# Patient Record
Sex: Male | Born: 1970
Health system: Southern US, Community
[De-identification: ages and names within clinical notes are randomized; demographics above are authoritative.]

## PROBLEM LIST (undated history)

## (undated) DIAGNOSIS — E785 Hyperlipidemia, unspecified: Secondary | ICD-10-CM

## (undated) DIAGNOSIS — E079 Disorder of thyroid, unspecified: Secondary | ICD-10-CM

## (undated) DIAGNOSIS — J309 Allergic rhinitis, unspecified: Secondary | ICD-10-CM

## (undated) DIAGNOSIS — T7840XA Allergy, unspecified, initial encounter: Secondary | ICD-10-CM

## (undated) DIAGNOSIS — I10 Essential (primary) hypertension: Secondary | ICD-10-CM

## (undated) DIAGNOSIS — Z8489 Family history of other specified conditions: Secondary | ICD-10-CM

## (undated) HISTORY — DX: Family history of other specified conditions: Z84.89

## (undated) HISTORY — DX: Disorder of thyroid, unspecified: E07.9

## (undated) HISTORY — DX: Essential (primary) hypertension: I10

## (undated) HISTORY — PX: OTHER SURGICAL HISTORY: SHX169

## (undated) HISTORY — PX: BACK SURGERY: SHX140

## (undated) HISTORY — DX: Hyperlipidemia, unspecified: E78.5

## (undated) HISTORY — DX: Allergy, unspecified, initial encounter: T78.40XA

## (undated) HISTORY — DX: Allergic rhinitis, unspecified: J30.9

## (undated) HISTORY — PX: SPINE SURGERY: SHX786

---

## 2002-10-18 ENCOUNTER — Encounter: Payer: Self-pay | Admitting: Family Medicine

## 2002-10-18 ENCOUNTER — Ambulatory Visit (HOSPITAL_COMMUNITY): Admission: RE | Admit: 2002-10-18 | Discharge: 2002-10-18 | Payer: Self-pay | Admitting: Family Medicine

## 2002-11-02 ENCOUNTER — Encounter (HOSPITAL_COMMUNITY): Admission: RE | Admit: 2002-11-02 | Discharge: 2002-12-02 | Payer: Self-pay | Admitting: Neurological Surgery

## 2002-12-12 ENCOUNTER — Encounter (HOSPITAL_COMMUNITY): Admission: RE | Admit: 2002-12-12 | Discharge: 2003-01-11 | Payer: Self-pay | Admitting: Neurological Surgery

## 2003-01-18 ENCOUNTER — Encounter: Admission: RE | Admit: 2003-01-18 | Discharge: 2003-03-02 | Payer: Self-pay | Admitting: Neurological Surgery

## 2003-08-29 ENCOUNTER — Inpatient Hospital Stay (HOSPITAL_COMMUNITY): Admission: RE | Admit: 2003-08-29 | Discharge: 2003-08-31 | Payer: Self-pay | Admitting: Neurological Surgery

## 2003-09-15 ENCOUNTER — Ambulatory Visit (HOSPITAL_COMMUNITY): Admission: RE | Admit: 2003-09-15 | Discharge: 2003-09-15 | Payer: Self-pay | Admitting: Family Medicine

## 2005-04-15 ENCOUNTER — Ambulatory Visit (HOSPITAL_COMMUNITY): Admission: RE | Admit: 2005-04-15 | Discharge: 2005-04-15 | Payer: Self-pay | Admitting: Family Medicine

## 2005-04-23 ENCOUNTER — Encounter (HOSPITAL_COMMUNITY): Admission: RE | Admit: 2005-04-23 | Discharge: 2005-05-23 | Payer: Self-pay | Admitting: Family Medicine

## 2005-04-30 ENCOUNTER — Ambulatory Visit: Payer: Self-pay | Admitting: Orthopedic Surgery

## 2005-05-02 ENCOUNTER — Ambulatory Visit (HOSPITAL_COMMUNITY): Admission: RE | Admit: 2005-05-02 | Discharge: 2005-05-02 | Payer: Self-pay | Admitting: Orthopedic Surgery

## 2005-05-14 ENCOUNTER — Ambulatory Visit: Payer: Self-pay | Admitting: Orthopedic Surgery

## 2005-06-26 ENCOUNTER — Ambulatory Visit: Payer: Self-pay | Admitting: Orthopedic Surgery

## 2006-10-06 ENCOUNTER — Ambulatory Visit (HOSPITAL_COMMUNITY): Admission: RE | Admit: 2006-10-06 | Discharge: 2006-10-06 | Payer: Self-pay | Admitting: Family Medicine

## 2010-12-12 ENCOUNTER — Other Ambulatory Visit: Payer: Self-pay | Admitting: Occupational Medicine

## 2010-12-12 ENCOUNTER — Ambulatory Visit
Admission: RE | Admit: 2010-12-12 | Discharge: 2010-12-12 | Disposition: A | Discharge status: Home / Self Care | Payer: PRIVATE HEALTH INSURANCE | Source: Ambulatory Visit | Attending: Occupational Medicine | Admitting: Occupational Medicine

## 2010-12-12 DIAGNOSIS — Z0289 Encounter for other administrative examinations: Secondary | ICD-10-CM

## 2012-11-21 DIAGNOSIS — J302 Other seasonal allergic rhinitis: Secondary | ICD-10-CM | POA: Insufficient documentation

## 2012-12-01 ENCOUNTER — Encounter: Payer: Self-pay | Admitting: *Deleted

## 2012-12-07 ENCOUNTER — Encounter: Payer: Self-pay | Admitting: Family Medicine

## 2012-12-07 ENCOUNTER — Ambulatory Visit (INDEPENDENT_AMBULATORY_CARE_PROVIDER_SITE_OTHER): Payer: 59 | Admitting: Family Medicine

## 2012-12-07 VITALS — BP 130/80 | HR 70 | Ht 68.0 in | Wt 196.0 lb

## 2012-12-07 DIAGNOSIS — R5383 Other fatigue: Secondary | ICD-10-CM

## 2012-12-07 DIAGNOSIS — Z79899 Other long term (current) drug therapy: Secondary | ICD-10-CM

## 2012-12-07 DIAGNOSIS — E785 Hyperlipidemia, unspecified: Secondary | ICD-10-CM

## 2012-12-07 DIAGNOSIS — R5381 Other malaise: Secondary | ICD-10-CM

## 2012-12-07 DIAGNOSIS — R748 Abnormal levels of other serum enzymes: Secondary | ICD-10-CM

## 2012-12-07 NOTE — Progress Notes (Signed)
  Subjective:    Patient ID: Brandon Williams, male    DOB: 1970-09-03, 42 y.o.   MRN: [id number]  HPI Exercising three to four days per week. Running. Tried to cut wieght, llost 20 lbs. Patient has history of elevated liver enzymes. He hopes they have come down since he lost weight. Also history of borderline TSH. None checked again recently. Known hyperlipidemia. Trying to watch his diet. Overall doing well. No acute concerns. Wonders if he is to colonoscopy. No family history of colon cancer. Patient advised not yet. Review of Systems  Constitutional: Negative for fever, activity change and appetite change.  HENT: Negative for congestion, rhinorrhea and neck pain.   Eyes: Negative for discharge.  Respiratory: Negative for cough and wheezing.   Cardiovascular: Negative for chest pain.  Gastrointestinal: Negative for vomiting, abdominal pain and blood in stool.  Genitourinary: Negative for frequency and difficulty urinating.  Skin: Negative for rash.  Allergic/Immunologic: Negative for environmental allergies and food allergies.  Neurological: Negative for weakness and headaches.  Psychiatric/Behavioral: Negative for agitation.       Objective:   Physical Exam  Constitutional: He appears well-developed and well-nourished.  HENT:  Head: Normocephalic and atraumatic.  Right Ear: External ear normal.  Left Ear: External ear normal.  Nose: Nose normal.  Mouth/Throat: Oropharynx is clear and moist.  Eyes: EOM are normal. Pupils are equal, round, and reactive to light.  Neck: Normal range of motion. Neck supple. No thyromegaly present.  Cardiovascular: Normal rate, regular rhythm and normal heart sounds.   No murmur heard. Pulmonary/Chest: Effort normal and breath sounds normal. No respiratory distress. He has no wheezes.  Abdominal: Soft. Bowel sounds are normal. He exhibits no distension and no mass. There is no tenderness.  Genitourinary: Penis normal.  Musculoskeletal: Normal  range of motion. He exhibits no edema.  Lymphadenopathy:    He has no cervical adenopathy.  Neurological: He is alert. He exhibits normal muscle tone.  Skin: Skin is warm and dry. No erythema.  Psychiatric: He has a normal mood and affect. His behavior is normal. Judgment normal.          Assessment & Plan:  Impression wellness exam. Plan diet exercise discussed in encourage. #2 hyperlipidemia status uncertain. #3 elevated liver enzyme history status uncertain. #4 borderline TSH status uncertain. Plan appropriate blood work. Diet exercise discussed.

## 2012-12-09 DIAGNOSIS — E785 Hyperlipidemia, unspecified: Secondary | ICD-10-CM | POA: Insufficient documentation

## 2012-12-09 DIAGNOSIS — R748 Abnormal levels of other serum enzymes: Secondary | ICD-10-CM | POA: Insufficient documentation

## 2012-12-10 LAB — HEPATIC FUNCTION PANEL
ALT: 32 IU/L (ref 0–44)
AST: 45 IU/L — ABNORMAL HIGH (ref 0–40)
Albumin: 4.4 g/dL (ref 3.5–5.5)
Alkaline Phosphatase: 61 IU/L (ref 39–117)
Bilirubin, Direct: 0.11 mg/dL (ref 0.00–0.40)
Total Bilirubin: 0.4 mg/dL (ref 0.0–1.2)
Total Protein: 6.5 g/dL (ref 6.0–8.5)

## 2012-12-10 LAB — BASIC METABOLIC PANEL
BUN/Creatinine Ratio: 13 (ref 9–20)
BUN: 14 mg/dL (ref 6–24)
CO2: 26 mmol/L (ref 19–28)
Calcium: 8.9 mg/dL (ref 8.7–10.2)
Chloride: 104 mmol/L (ref 97–108)
Creatinine, Ser: 1.07 mg/dL (ref 0.76–1.27)
GFR calc Af Amer: 99 mL/min/{1.73_m2} (ref >59)
GFR calc non Af Amer: 86 mL/min/{1.73_m2} (ref >59)
Glucose: 86 mg/dL (ref 65–99)
Potassium: 4.5 mmol/L (ref 3.5–5.2)
Sodium: 142 mmol/L (ref 134–144)

## 2012-12-10 LAB — TSH: TSH: 5.95 u[IU]/mL — ABNORMAL HIGH (ref 0.450–4.500)

## 2012-12-10 LAB — LIPID PANEL
Chol/HDL Ratio: 4.5 ratio units (ref 0.0–5.0)
Cholesterol, Total: 190 mg/dL (ref 100–199)
HDL: 42 mg/dL (ref >39)
LDL Calculated: 124 mg/dL — ABNORMAL HIGH (ref 0–99)
Triglycerides: 122 mg/dL (ref 0–149)
VLDL Cholesterol Cal: 24 mg/dL (ref 5–40)

## 2012-12-17 ENCOUNTER — Telehealth: Payer: Self-pay | Admitting: Family Medicine

## 2012-12-17 NOTE — Telephone Encounter (Signed)
Patient stated he can see his results in My chart and sees a lot of abnormal results and is concerned. Results are in your results basket.

## 2012-12-17 NOTE — Telephone Encounter (Signed)
Call pt. o v next wk to discuss results and approach to findings. Too complicated to discuss over the phone and will require some further testing that we will discuss and arrange then

## 2012-12-17 NOTE — Telephone Encounter (Signed)
Patient is calling to get his BW results.

## 2012-12-17 NOTE — Telephone Encounter (Signed)
Office visit scheduled to discuss labs results.

## 2012-12-22 ENCOUNTER — Encounter: Payer: Self-pay | Admitting: Family Medicine

## 2012-12-22 ENCOUNTER — Ambulatory Visit (INDEPENDENT_AMBULATORY_CARE_PROVIDER_SITE_OTHER): Payer: 59 | Admitting: Family Medicine

## 2012-12-22 VITALS — BP 110/78 | HR 70 | Wt 201.6 lb

## 2012-12-22 DIAGNOSIS — R748 Abnormal levels of other serum enzymes: Secondary | ICD-10-CM

## 2012-12-22 DIAGNOSIS — E785 Hyperlipidemia, unspecified: Secondary | ICD-10-CM

## 2012-12-22 NOTE — Progress Notes (Signed)
  Subjective:    Patient ID: Brandon Williams, male    DOB: 1971-01-29, 42 y.o.   MRN: [id number]  HPI  Results for orders placed in visit on 12/07/12  LIPID PANEL      Result Value Range   Cholesterol, Total 190  100 - 199 mg/dL   Triglycerides 956  0 - 149 mg/dL   HDL 42  >21 mg/dL   VLDL Cholesterol Cal 24  5 - 40 mg/dL   LDL Calculated 308 (*) 0 - 99 mg/dL   Chol/HDL Ratio 4.5  0.0 - 5.0 ratio units  HEPATIC FUNCTION PANEL      Result Value Range   Total Protein 6.5  6.0 - 8.5 g/dL   Albumin 4.4  3.5 - 5.5 g/dL   Total Bilirubin 0.4  0.0 - 1.2 mg/dL   Bilirubin, Direct 6.57  0.00 - 0.40 mg/dL   Alkaline Phosphatase 61  39 - 117 IU/L   AST 45 (*) 0 - 40 IU/L   ALT 32  0 - 44 IU/L  TSH      Result Value Range   TSH 5.950 (*) 0.450 - 4.500 uIU/mL   COMMENT Comment    BASIC METABOLIC PANEL      Result Value Range   Glucose 86  65 - 99 mg/dL   BUN 14  6 - 24 mg/dL   Creatinine, Ser 8.46  0.76 - 1.27 mg/dL   GFR calc non Af Amer 86  >59 mL/min/1.73   GFR calc Af Amer 99  >59 mL/min/1.73   BUN/Creatinine Ratio 13  9 - 20   Sodium 142  134 - 144 mmol/L   Potassium 4.5  3.5 - 5.2 mmol/L   Chloride 104  97 - 108 mmol/L   CO2 26  19 - 28 mmol/L   Calcium 8.9  8.7 - 10.2 mg/dL   Patient arrives for a substantial discussion regarding his blood work results  Review of Systems ROS otherwise negative    Objective:   Physical Exam   Alert no acute distress HEENT normal. Lungs clear. Heart regular rate and rhythm. Vital stable.     Assessment & Plan:  Impression #1 mild elevation of liver enzymes discussed at great length. He has had a prior workup in the past. This revealed fatty liver. In addition he had negative studies for hepatitis and negative studies for hemachromatosis. #2 hyperlipidemia and discuss. #3 TSH slightly elevated with no symptoms of hypothyroidism. Plan 15 minutes spent most in discussion. Patient to work hard on his diet and repeat the studies next year.  WSL

## 2013-04-11 ENCOUNTER — Encounter: Payer: Self-pay | Admitting: Family Medicine

## 2013-04-11 NOTE — Telephone Encounter (Signed)
Thank you for your request for refills. For standard refills we will request that you, the patient ,contact your pharmacy to send [place] an electronic request for refills or a fax requesting the refills. Almost all the pharmacies that our patients go to routinely do this for refills. This way we can accurately and quickly refill your prescription. Please contact your pharmacy right away for this request. Controlled medications such as nerve pills or pain pills may be requested through My Chart. Thank you for your cooperation, [facility]

## 2013-05-12 ENCOUNTER — Other Ambulatory Visit: Payer: Self-pay

## 2014-12-11 ENCOUNTER — Encounter: Payer: Self-pay | Admitting: Family Medicine

## 2014-12-11 ENCOUNTER — Ambulatory Visit (INDEPENDENT_AMBULATORY_CARE_PROVIDER_SITE_OTHER): Payer: 59 | Admitting: Family Medicine

## 2014-12-11 VITALS — BP 132/84 | Ht 68.0 in | Wt 211.3 lb

## 2014-12-11 DIAGNOSIS — R5383 Other fatigue: Secondary | ICD-10-CM

## 2014-12-11 DIAGNOSIS — E785 Hyperlipidemia, unspecified: Secondary | ICD-10-CM

## 2014-12-11 DIAGNOSIS — Z0000 Encounter for general adult medical examination without abnormal findings: Secondary | ICD-10-CM | POA: Diagnosis not present

## 2014-12-11 DIAGNOSIS — Z79899 Other long term (current) drug therapy: Secondary | ICD-10-CM | POA: Diagnosis not present

## 2014-12-11 NOTE — Progress Notes (Signed)
   Subjective:    Patient ID: Brandon Williams, male    DOB: [birth year], 44 y.o.   MRN: [id number]  HPI The patient comes in today for a wellness visit.    A review of their health history was completed.  A review of medications was also completed.  Any needed refills; no  Eating habits: could be better  Falls/  MVA accidents in past few months: none  Regular exercise: 3-4 days a week  Specialist pt sees on regular basis: no  Preventative health issues were discussed.   Additional concerns: none  Still exercising regulaly running and going to the gym three or four days per wk  Not watching diet as closely  Some tendency on fa's side to gain weight.    Review of Systems  Constitutional: Negative for fever, activity change and appetite change.  HENT: Negative for congestion and rhinorrhea.   Eyes: Negative for discharge.  Respiratory: Negative for cough and wheezing.   Cardiovascular: Negative for chest pain.  Gastrointestinal: Negative for vomiting, abdominal pain and blood in stool.  Genitourinary: Negative for frequency and difficulty urinating.  Musculoskeletal: Negative for neck pain.  Skin: Negative for rash.  Allergic/Immunologic: Negative for environmental allergies and food allergies.  Neurological: Negative for weakness and headaches.  Psychiatric/Behavioral: Negative for agitation.  All other systems reviewed and are negative.      Objective:   Physical Exam  Constitutional: He appears well-developed and well-nourished.  HENT:  Head: Normocephalic and atraumatic.  Right Ear: External ear normal.  Left Ear: External ear normal.  Nose: Nose normal.  Mouth/Throat: Oropharynx is clear and moist.  Eyes: EOM are normal. Pupils are equal, round, and reactive to light.  Neck: Normal range of motion. Neck supple. No thyromegaly present.  Cardiovascular: Normal rate, regular rhythm and normal heart sounds.   No murmur heard. Pulmonary/Chest: Effort  normal and breath sounds normal. No respiratory distress. He has no wheezes.  Abdominal: Soft. Bowel sounds are normal. He exhibits no distension and no mass. There is no tenderness.  Genitourinary: Prostate normal and penis normal.  Musculoskeletal: Normal range of motion. He exhibits no edema.  Lymphadenopathy:    He has no cervical adenopathy.  Neurological: He is alert. He exhibits normal muscle tone.  Skin: Skin is warm and dry. No erythema.  Psychiatric: He has a normal mood and affect. His behavior is normal. Judgment normal.          Assessment & Plan:  Impression #1 wellness exam #2 history of borderline TSH. #3 elevated liver enzymes with fatty liver plan diet exercise discussed. Appropriate blood work. Form filled out. WSL

## 2014-12-12 LAB — HEPATIC FUNCTION PANEL
ALT: 35 IU/L (ref 0–44)
AST: 38 IU/L (ref 0–40)
Albumin: 4.7 g/dL (ref 3.5–5.5)
Alkaline Phosphatase: 66 IU/L (ref 39–117)
Bilirubin Total: 0.7 mg/dL (ref 0.0–1.2)
Bilirubin, Direct: 0.17 mg/dL (ref 0.00–0.40)
Total Protein: 6.9 g/dL (ref 6.0–8.5)

## 2014-12-12 LAB — TSH: TSH: 6.26 u[IU]/mL — ABNORMAL HIGH (ref 0.450–4.500)

## 2014-12-12 LAB — BASIC METABOLIC PANEL
BUN/Creatinine Ratio: 12 (ref 9–20)
BUN: 14 mg/dL (ref 6–24)
CO2: 24 mmol/L (ref 18–29)
Calcium: 9.1 mg/dL (ref 8.7–10.2)
Chloride: 100 mmol/L (ref 97–108)
Creatinine, Ser: 1.18 mg/dL (ref 0.76–1.27)
GFR calc Af Amer: 87 mL/min/{1.73_m2} (ref >59)
GFR calc non Af Amer: 75 mL/min/{1.73_m2} (ref >59)
Glucose: 94 mg/dL (ref 65–99)
Potassium: 4.5 mmol/L (ref 3.5–5.2)
Sodium: 140 mmol/L (ref 134–144)

## 2014-12-12 LAB — LIPID PANEL
Chol/HDL Ratio: 6.2 ratio units — ABNORMAL HIGH (ref 0.0–5.0)
Cholesterol, Total: 193 mg/dL (ref 100–199)
HDL: 31 mg/dL — ABNORMAL LOW (ref >39)
LDL Calculated: 107 mg/dL — ABNORMAL HIGH (ref 0–99)
Triglycerides: 275 mg/dL — ABNORMAL HIGH (ref 0–149)
VLDL Cholesterol Cal: 55 mg/dL — ABNORMAL HIGH (ref 5–40)

## 2014-12-19 ENCOUNTER — Other Ambulatory Visit: Payer: Self-pay

## 2014-12-19 MED ORDER — LEVOTHYROXINE SODIUM 50 MCG PO TABS: 50.0000 ug | ORAL_TABLET | Freq: Every day | ORAL | Status: DC

## 2015-03-12 ENCOUNTER — Encounter: Payer: Self-pay | Admitting: Family Medicine

## 2015-03-12 DIAGNOSIS — E039 Hypothyroidism, unspecified: Secondary | ICD-10-CM

## 2015-04-06 ENCOUNTER — Telehealth: Payer: Self-pay | Admitting: Family Medicine

## 2015-04-06 NOTE — Telephone Encounter (Signed)
[facility] to fax results

## 2015-04-06 NOTE — Telephone Encounter (Signed)
Pt is looking for his results for his TSH form 9/6  He states he did not get an email  Please advise

## 2015-04-06 NOTE — Telephone Encounter (Signed)
Results on desk- patient wants results today

## 2015-04-06 NOTE — Telephone Encounter (Signed)
Results discussed with patient. Patient verbalized understanding.

## 2015-04-06 NOTE — Telephone Encounter (Signed)
tsh excellent , 2.11 no need for further tests, send copy to pt

## 2015-06-06 ENCOUNTER — Other Ambulatory Visit: Payer: Self-pay | Admitting: Family Medicine

## 2015-06-18 ENCOUNTER — Telehealth: Payer: Self-pay | Admitting: Family Medicine

## 2015-06-18 NOTE — Telephone Encounter (Signed)
Pt is needing a refill on his levothyroxine. Pt ran out yesterday.   [facility] [place]

## 2015-06-18 NOTE — Telephone Encounter (Signed)
Spoke with patient and informed him that we sent in 5 refills on 06/06/15. Patient verbalized understanding.

## 2015-07-26 ENCOUNTER — Other Ambulatory Visit: Payer: Self-pay | Admitting: Nurse Practitioner

## 2015-07-26 ENCOUNTER — Ambulatory Visit
Admission: RE | Admit: 2015-07-26 | Discharge: 2015-07-26 | Disposition: A | Discharge status: Home / Self Care | Payer: No Typology Code available for payment source | Source: Ambulatory Visit | Attending: Nurse Practitioner | Admitting: Nurse Practitioner

## 2015-07-26 DIAGNOSIS — Z0000 Encounter for general adult medical examination without abnormal findings: Secondary | ICD-10-CM

## 2015-09-07 ENCOUNTER — Encounter: Payer: Self-pay | Admitting: Family Medicine

## 2015-09-07 MED ORDER — LEVOTHYROXINE SODIUM 50 MCG PO TABS: ORAL_TABLET | ORAL | Status: DC

## 2015-11-26 ENCOUNTER — Other Ambulatory Visit: Payer: Self-pay | Admitting: *Deleted

## 2015-11-26 MED ORDER — LEVOTHYROXINE SODIUM 50 MCG PO TABS: ORAL_TABLET | ORAL | Status: DC

## 2016-02-17 ENCOUNTER — Other Ambulatory Visit: Payer: Self-pay | Admitting: Family Medicine

## 2016-02-18 NOTE — Telephone Encounter (Signed)
Pt not seen 14 months, thierty days worth needs o v

## 2016-02-29 ENCOUNTER — Encounter: Payer: Self-pay | Admitting: Family Medicine

## 2016-02-29 DIAGNOSIS — Z139 Encounter for screening, unspecified: Secondary | ICD-10-CM

## 2016-03-22 LAB — BASIC METABOLIC PANEL
BUN/Creatinine Ratio: 10 (ref 9–20)
BUN: 12 mg/dL (ref 6–24)
CALCIUM: 9.2 mg/dL (ref 8.7–10.2)
CHLORIDE: 99 mmol/L (ref 96–106)
CO2: 24 mmol/L (ref 18–29)
Creatinine, Ser: 1.15 mg/dL (ref 0.76–1.27)
GFR calc non Af Amer: 76 mL/min/{1.73_m2} (ref >59)
GFR, EST AFRICAN AMERICAN: 88 mL/min/{1.73_m2} (ref >59)
GLUCOSE: 97 mg/dL (ref 65–99)
Potassium: 4.7 mmol/L (ref 3.5–5.2)
Sodium: 138 mmol/L (ref 134–144)

## 2016-03-22 LAB — TSH: TSH: 4.05 u[IU]/mL (ref 0.450–4.500)

## 2016-03-22 LAB — LIPID PANEL
Chol/HDL Ratio: 7.4 ratio units — ABNORMAL HIGH (ref 0.0–5.0)
Cholesterol, Total: 236 mg/dL — ABNORMAL HIGH (ref 100–199)
HDL: 32 mg/dL — ABNORMAL LOW (ref >39)
LDL Calculated: 133 mg/dL — ABNORMAL HIGH (ref 0–99)
Triglycerides: 356 mg/dL — ABNORMAL HIGH (ref 0–149)
VLDL Cholesterol Cal: 71 mg/dL — ABNORMAL HIGH (ref 5–40)

## 2016-03-22 LAB — HEPATIC FUNCTION PANEL
ALT: 53 IU/L — AB (ref 0–44)
AST: 50 IU/L — ABNORMAL HIGH (ref 0–40)
Albumin: 4.8 g/dL (ref 3.5–5.5)
Alkaline Phosphatase: 60 IU/L (ref 39–117)
BILIRUBIN TOTAL: 0.5 mg/dL (ref 0.0–1.2)
Bilirubin, Direct: 0.15 mg/dL (ref 0.00–0.40)
Total Protein: 7.3 g/dL (ref 6.0–8.5)

## 2016-04-10 ENCOUNTER — Encounter: Payer: Self-pay | Admitting: Family Medicine

## 2016-04-10 ENCOUNTER — Ambulatory Visit (INDEPENDENT_AMBULATORY_CARE_PROVIDER_SITE_OTHER): Payer: 59 | Admitting: Family Medicine

## 2016-04-10 VITALS — BP 112/80 | Ht 69.5 in | Wt 224.4 lb

## 2016-04-10 DIAGNOSIS — Z13220 Encounter for screening for lipoid disorders: Secondary | ICD-10-CM | POA: Diagnosis not present

## 2016-04-10 DIAGNOSIS — Z79899 Other long term (current) drug therapy: Secondary | ICD-10-CM

## 2016-04-10 DIAGNOSIS — R748 Abnormal levels of other serum enzymes: Secondary | ICD-10-CM

## 2016-04-10 DIAGNOSIS — E039 Hypothyroidism, unspecified: Secondary | ICD-10-CM

## 2016-04-10 DIAGNOSIS — Z0000 Encounter for general adult medical examination without abnormal findings: Secondary | ICD-10-CM

## 2016-04-10 MED ORDER — LEVOTHYROXINE SODIUM 75 MCG PO TABS: 75.0000 ug | ORAL_TABLET | Freq: Every day | ORAL | 5 refills | Status: DC

## 2016-04-10 NOTE — Progress Notes (Signed)
Subjective:    Patient ID: Brandon Williams, male    DOB: Nov 23, 1970, 45 y.o.   MRN: [id number]  HPI The patient comes in today for a wellness visit.   Results for orders placed or performed in visit on 02/29/16  Lipid panel  Result Value Ref Range   Cholesterol, Total 236 (H) 100 - 199 mg/dL   Triglycerides 356 (H) 0 - 149 mg/dL   HDL 32 (L) >39 mg/dL   VLDL Cholesterol Cal 71 (H) 5 - 40 mg/dL   LDL Calculated 133 (H) 0 - 99 mg/dL   Chol/HDL Ratio 7.4 (H) 0.0 - 5.0 ratio units  Hepatic function panel  Result Value Ref Range   Total Protein 7.3 6.0 - 8.5 g/dL   Albumin 4.8 3.5 - 5.5 g/dL   Bilirubin Total 0.5 0.0 - 1.2 mg/dL   Bilirubin, Direct 0.15 0.00 - 0.40 mg/dL   Alkaline Phosphatase 60 39 - 117 IU/L   AST 50 (H) 0 - 40 IU/L   ALT 53 (H) 0 - 44 IU/L  Basic metabolic panel  Result Value Ref Range   Glucose 97 65 - 99 mg/dL   BUN 12 6 - 24 mg/dL   Creatinine, Ser 1.15 0.76 - 1.27 mg/dL   GFR calc non Af Amer 76 >59 mL/min/1.73   GFR calc Af Amer 88 >59 mL/min/1.73   BUN/Creatinine Ratio 10 9 - 20   Sodium 138 134 - 144 mmol/L   Potassium 4.7 3.5 - 5.2 mmol/L   Chloride 99 96 - 106 mmol/L   CO2 24 18 - 29 mmol/L   Calcium 9.2 8.7 - 10.2 mg/dL  TSH  Result Value Ref Range   TSH 4.050 0.450 - 4.500 uIU/mL    A review of their health history was completed.  A review of medications was also completed.  Any needed refills: 90 day supply on levothyroxine Pos fam hx of thyroid dys function in fa and sister Slight fatigue at times but not particularly. Does not miss a dose on the thyroid   Eating habits: good   Falls/  MVA accidents in past few months: none  Regular exercise: not recently ,not currently   Specialist pt sees on regular basis: none  Preventative health issues were discussed.   Additional concerns: discuss bloodwork results  Patient gets flu shot through employer.   Review of Systems  Constitutional: Negative for activity change, appetite  change and fever.  HENT: Negative for congestion and rhinorrhea.   Eyes: Negative for discharge.  Respiratory: Negative for cough and wheezing.   Cardiovascular: Negative for chest pain.  Gastrointestinal: Negative for abdominal pain, blood in stool and vomiting.  Genitourinary: Negative for difficulty urinating and frequency.  Musculoskeletal: Negative for neck pain.  Skin: Negative for rash.  Allergic/Immunologic: Negative for environmental allergies and food allergies.  Neurological: Negative for weakness and headaches.  Psychiatric/Behavioral: Negative for agitation.  All other systems reviewed and are negative.      Objective:   Physical Exam  Constitutional: He appears well-developed and well-nourished.  HENT:  Head: Normocephalic and atraumatic.  Right Ear: External ear normal.  Left Ear: External ear normal.  Nose: Nose normal.  Mouth/Throat: Oropharynx is clear and moist.  Eyes: EOM are normal. Pupils are equal, round, and reactive to light.  Neck: Normal range of motion. Neck supple. No thyromegaly present.  Cardiovascular: Normal rate, regular rhythm and normal heart sounds.   No murmur heard. Pulmonary/Chest: Effort normal and breath sounds normal. No  respiratory distress. He has no wheezes.  Abdominal: Soft. Bowel sounds are normal. He exhibits no distension and no mass. There is no tenderness.  Genitourinary: Penis normal.  Genitourinary Comments: Prostate within normal limits  Musculoskeletal: Normal range of motion. He exhibits no edema.  Lymphadenopathy:    He has no cervical adenopathy.  Neurological: He is alert. He exhibits normal muscle tone.  Skin: Skin is warm and dry. No erythema.  Psychiatric: He has a normal mood and affect. His behavior is normal. Judgment normal.  Vitals reviewed.         Assessment & Plan:  Impression 1 well adult exam #2 hyperlipidemia discussed at length. #3 elevated liver enzymes. Has gained 10 pounds likely source  discussed needs to come down at next check discussed #3 hypothyroidism TSH now highish will increased dose and recheck in 4 months plan appropriate blood work in 4 months diet exercise discussed anticipatory guidance given WSL

## 2016-10-09 ENCOUNTER — Other Ambulatory Visit: Payer: Self-pay | Admitting: Family Medicine

## 2017-01-27 ENCOUNTER — Encounter: Payer: Self-pay | Admitting: Family Medicine

## 2017-01-27 ENCOUNTER — Ambulatory Visit (INDEPENDENT_AMBULATORY_CARE_PROVIDER_SITE_OTHER): Payer: 59 | Admitting: Family Medicine

## 2017-01-27 VITALS — BP 122/78 | Ht 69.5 in | Wt 217.6 lb

## 2017-01-27 DIAGNOSIS — L03113 Cellulitis of right upper limb: Secondary | ICD-10-CM | POA: Diagnosis not present

## 2017-01-27 NOTE — Progress Notes (Signed)
   Subjective:    Patient ID: Brandon Williams, male    DOB: 12-21-1970, 46 y.o.   MRN: [id number]  HPI Patient arrives for removal of stitches in right hand-injury due to dog bite.  Patient brings pictures of the wounds prior to repair. Of note patient's hands were actually bitten twice over the course of 2 days.  He is approximate 5 days into a 10 day course of Augmentin. Next  Report substantially less swelling of the right hand at this time. Less tenderness.  Needs sutures removed.  ER records reviewed Review of Systems No headache, no major weight loss or weight gain, no chest pain no back pain abdominal pain no change in bowel habits complete ROS otherwise negative     Objective:   Physical Exam  Alert vitals stable, NAD. Blood pressure good on repeat. HEENT normal. Lungs clear. Heart regular rate and rhythm. Right hand still substantial swelling evident some tenderness to deep palpation able to home without significant pain  Wounds healing slowly sutures removed      Assessment & Plan:  Impression dog bite with both puncture wounds and lacerations. Potential element of cellulitis clinically improved. Highly doubt webspace infection. Continue antibiotics 1 signs discussed

## 2017-04-09 ENCOUNTER — Other Ambulatory Visit: Payer: Self-pay | Admitting: Family Medicine

## 2017-05-10 ENCOUNTER — Other Ambulatory Visit: Payer: Self-pay | Admitting: Family Medicine

## 2017-05-14 ENCOUNTER — Other Ambulatory Visit: Payer: Self-pay | Admitting: Family Medicine

## 2017-05-15 LAB — HEPATIC FUNCTION PANEL
ALT: 48 IU/L — ABNORMAL HIGH (ref 0–44)
AST: 48 IU/L — ABNORMAL HIGH (ref 0–40)
Albumin: 4.7 g/dL (ref 3.5–5.5)
Alkaline Phosphatase: 56 IU/L (ref 39–117)
Bilirubin Total: 0.4 mg/dL (ref 0.0–1.2)
Bilirubin, Direct: 0.12 mg/dL (ref 0.00–0.40)
TOTAL PROTEIN: 7 g/dL (ref 6.0–8.5)

## 2017-05-15 LAB — LIPID PANEL
Chol/HDL Ratio: 6.4 ratio — ABNORMAL HIGH (ref 0.0–5.0)
Cholesterol, Total: 211 mg/dL — ABNORMAL HIGH (ref 100–199)
HDL: 33 mg/dL — ABNORMAL LOW (ref >39)
LDL Calculated: 125 mg/dL — ABNORMAL HIGH (ref 0–99)
Triglycerides: 267 mg/dL — ABNORMAL HIGH (ref 0–149)
VLDL Cholesterol Cal: 53 mg/dL — ABNORMAL HIGH (ref 5–40)

## 2017-05-15 LAB — TSH: TSH: 2.83 u[IU]/mL (ref 0.450–4.500)

## 2017-05-21 MED ORDER — LEVOTHYROXINE SODIUM 75 MCG PO TABS: ORAL_TABLET | ORAL | 0 refills | Status: DC

## 2017-05-21 NOTE — Addendum Note (Signed)
Addended by: Ofilia Neas R on: 05/21/2017 02:41 PM   Modules accepted: Orders

## 2017-05-21 NOTE — Addendum Note (Signed)
Addended by: Ofilia Neas R on: 05/21/2017 01:34 PM   Modules accepted: Orders

## 2017-06-05 ENCOUNTER — Encounter: Payer: 59 | Admitting: Family Medicine

## 2017-06-26 ENCOUNTER — Ambulatory Visit (INDEPENDENT_AMBULATORY_CARE_PROVIDER_SITE_OTHER): Payer: 59 | Admitting: Family Medicine

## 2017-06-26 ENCOUNTER — Encounter: Payer: Self-pay | Admitting: Family Medicine

## 2017-06-26 VITALS — BP 122/88 | Ht 69.25 in | Wt 222.0 lb

## 2017-06-26 DIAGNOSIS — R748 Abnormal levels of other serum enzymes: Secondary | ICD-10-CM | POA: Diagnosis not present

## 2017-06-26 DIAGNOSIS — Z0001 Encounter for general adult medical examination with abnormal findings: Secondary | ICD-10-CM | POA: Diagnosis not present

## 2017-06-26 DIAGNOSIS — E039 Hypothyroidism, unspecified: Secondary | ICD-10-CM | POA: Diagnosis not present

## 2017-06-26 DIAGNOSIS — Z0000 Encounter for general adult medical examination without abnormal findings: Secondary | ICD-10-CM

## 2017-06-26 MED ORDER — LEVOTHYROXINE SODIUM 75 MCG PO TABS: ORAL_TABLET | ORAL | 0 refills | Status: DC

## 2017-06-26 NOTE — Progress Notes (Signed)
Subjective:    Patient ID: Brandon Williams, male    DOB: 16-Nov-1970, 46 y.o.   MRN: [id number]  HPI The patient comes in today for a wellness visit.  Results for orders placed or performed in visit on 05/14/17  Lipid panel  Result Value Ref Range   Cholesterol, Total 211 (H) 100 - 199 mg/dL   Triglycerides 267 (H) 0 - 149 mg/dL   HDL 33 (L) >39 mg/dL   VLDL Cholesterol Cal 53 (H) 5 - 40 mg/dL   LDL Calculated 125 (H) 0 - 99 mg/dL   Chol/HDL Ratio 6.4 (H) 0.0 - 5.0 ratio  Hepatic function panel  Result Value Ref Range   Total Protein 7.0 6.0 - 8.5 g/dL   Albumin 4.7 3.5 - 5.5 g/dL   Bilirubin Total 0.4 0.0 - 1.2 mg/dL   Bilirubin, Direct 0.12 0.00 - 0.40 mg/dL   Alkaline Phosphatase 56 39 - 117 IU/L   AST 48 (H) 0 - 40 IU/L   ALT 48 (H) 0 - 44 IU/L  TSH  Result Value Ref Range   TSH 2.830 0.450 - 4.500 uIU/mL     A review of their health history was completed.  A review of medications was also completed.  Any needed refills; yes on levothyroxine. Wants 90 day supply  Eating habits: health conscious  Falls/  MVA accidents in past few months: none  Regular exercise: none  Specialist pt sees on regular basis:  Preventative health issues were discussed.   Additional concerns: none    Review of Systems  Constitutional: Negative for activity change, appetite change and fever.  HENT: Negative for congestion and rhinorrhea.   Eyes: Negative for discharge.  Respiratory: Negative for cough and wheezing.   Cardiovascular: Negative for chest pain.  Gastrointestinal: Negative for abdominal pain, blood in stool and vomiting.  Genitourinary: Negative for difficulty urinating and frequency.  Musculoskeletal: Negative for neck pain.  Skin: Negative for rash.  Allergic/Immunologic: Negative for environmental allergies and food allergies.  Neurological: Negative for weakness and headaches.  Psychiatric/Behavioral: Negative for agitation.  All other systems reviewed and  are negative.      Objective:   Physical Exam  Constitutional: He appears well-developed and well-nourished.  HENT:  Head: Normocephalic and atraumatic.  Right Ear: External ear normal.  Left Ear: External ear normal.  Nose: Nose normal.  Mouth/Throat: Oropharynx is clear and moist.  Eyes: EOM are normal. Pupils are equal, round, and reactive to light.  Neck: Normal range of motion. Neck supple. No thyromegaly present.  Cardiovascular: Normal rate, regular rhythm and normal heart sounds.  No murmur heard. Pulmonary/Chest: Effort normal and breath sounds normal. No respiratory distress. He has no wheezes.  Abdominal: Soft. Bowel sounds are normal. He exhibits no distension and no mass. There is no tenderness.  Genitourinary: Penis normal.  Musculoskeletal: Normal range of motion. He exhibits no edema.  Lymphadenopathy:    He has no cervical adenopathy.  Neurological: He is alert. He exhibits normal muscle tone.  Skin: Skin is warm and dry. No erythema.  Psychiatric: He has a normal mood and affect. His behavior is normal. Judgment normal.  Vitals reviewed.         Assessment & Plan:  p impression wellness exam.  Diet discussed.  Exercise discussed.  Weight loss discussed.  General concerns discussed.  Anticipatory guidance given.  2.  Hypothyroidism.  TSH good control.  Discussed to maintain same dose of thyroid medicine 1 years worth written  3.  Elevated liver enzymes.  Discussed time to further assess.  Will order a last raphe of liver along with appropriate blood work.  Further recommendations based on results

## 2017-06-27 ENCOUNTER — Other Ambulatory Visit: Payer: Self-pay | Admitting: Family Medicine

## 2017-06-27 LAB — HEPATITIS C ANTIBODY: Hep C Virus Ab: <0.1 s/co ratio (ref 0.0–0.9)

## 2017-06-27 LAB — IRON: IRON: 46 ug/dL (ref 38–169)

## 2017-06-27 LAB — HEPATITIS B SURFACE ANTIGEN: Hepatitis B Surface Ag: NEGATIVE

## 2017-06-27 LAB — FERRITIN: FERRITIN: 306 ng/mL (ref 30–400)

## 2017-07-01 ENCOUNTER — Ambulatory Visit (HOSPITAL_COMMUNITY)
Admission: RE | Admit: 2017-07-01 | Discharge: 2017-07-01 | Disposition: A | Discharge status: Home / Self Care | Payer: 59 | Source: Ambulatory Visit | Attending: Family Medicine | Admitting: Family Medicine

## 2017-07-01 DIAGNOSIS — R748 Abnormal levels of other serum enzymes: Secondary | ICD-10-CM | POA: Diagnosis present

## 2017-07-01 DIAGNOSIS — K760 Fatty (change of) liver, not elsewhere classified: Secondary | ICD-10-CM | POA: Diagnosis not present

## 2017-07-23 ENCOUNTER — Encounter: Payer: Self-pay | Admitting: Family Medicine

## 2017-07-24 MED ORDER — LEVOTHYROXINE SODIUM 75 MCG PO TABS: ORAL_TABLET | ORAL | 1 refills | Status: DC

## 2017-10-23 ENCOUNTER — Emergency Department
Admission: EM | Admit: 2017-10-23 | Discharge: 2017-10-23 | Disposition: A | Discharge status: Home / Self Care | Payer: 59 | Source: Home / Self Care | Attending: Family Medicine | Admitting: Family Medicine

## 2017-10-23 ENCOUNTER — Other Ambulatory Visit: Payer: Self-pay

## 2017-10-23 ENCOUNTER — Encounter: Payer: Self-pay | Admitting: Emergency Medicine

## 2017-10-23 DIAGNOSIS — H1013 Acute atopic conjunctivitis, bilateral: Secondary | ICD-10-CM

## 2017-10-23 MED ORDER — OLOPATADINE HCL 0.2 % OP SOLN: OPHTHALMIC | 0 refills | Status: DC

## 2017-10-23 NOTE — Discharge Instructions (Addendum)
Continue daily antihistamine.

## 2017-10-23 NOTE — ED Triage Notes (Signed)
Patient has seasonal allergies and began daily antihistamine 09/04/17; despite OTCs he has been unusually congested and eyes are very irritated.

## 2017-10-23 NOTE — ED Provider Notes (Signed)
[facility]    CSN: [id number] Arrival date & time: 10/23/17  1420     History   Chief Complaint Chief Complaint  Patient presents with  . Nasal Congestion  . Eye Problem    HPI Brandon Williams is a 47 y.o. male.   Patient has seasonal allergic rhinitis and complains of two week history of increased "watering" eyes without significant redness or pain.  No foreign body sensation.  His eye symptoms have not improved with loratadine.  The history is provided by the patient.  Eye Problem  Location:  Both eyes Quality:  Tearing Severity:  Mild Onset quality:  Gradual Duration:  2 weeks Timing:  Intermittent Progression:  Worsening Chronicity:  Recurrent Context: not burn, not chemical exposure, not contact lens problem, not direct trauma, not foreign body, not using machinery, not scratch, not smoke exposure and not UV exposure   Relieved by:  Nothing Worsened by:  Nothing Ineffective treatments: antihistamines. Associated symptoms: itching and tearing   Associated symptoms: no blurred vision, no crusting, no decreased vision, no discharge, no double vision, no facial rash, no headaches, no inflammation, no photophobia, no redness, no scotomas and no swelling   Risk factors: no recent URI     Past Medical History:  Diagnosis Date  . Allergic rhinitis   . Hyperlipidemia     Patient Active Problem List   Diagnosis Date Noted  . Other and unspecified hyperlipidemia 12/09/2012  . Elevated liver enzymes 12/09/2012    Past Surgical History:  Procedure Laterality Date  . BACK SURGERY    . thumb surgery         Home Medications    Prior to Admission medications   Medication Sig Start Date End Date Taking? Authorizing Provider  levothyroxine (SYNTHROID, LEVOTHROID) 75 MCG tablet TAKE 1 TABLET BY MOUTH BEFORE BREAKFAST 07/24/17   Mikey Kirschner, MD  Olopatadine HCl 0.2 % SOLN Place one drop in each eye once daily for allergic symptoms 10/23/17    Kandra Nicolas, MD    Family History History reviewed. No pertinent family history.  Social History Social History   Tobacco Use  . Smoking status: Never Smoker  . Smokeless tobacco: Never Used  Substance Use Topics  . Alcohol use: Yes  . Drug use: Not on file     Allergies   Patient has no known allergies.   Review of Systems Review of Systems  Eyes: Positive for itching. Negative for blurred vision, double vision, photophobia, discharge and redness.  Neurological: Negative for headaches.  All other systems reviewed and are negative.  No sore throat No cough No pleuritic pain No wheezing + nasal congestion No post-nasal drainage No sinus pain/pressure + itchy eyes and increased lacrimation No earache No hemoptysis No SOB No fever/chills No nausea No vomiting No abdominal pain No diarrhea No urinary symptoms No skin rash No fatigue No myalgias No headache Used OTC meds without relief   Physical Exam Triage Vital Signs ED Triage Vitals  Enc Vitals Group     BP 10/23/17 1521 114/74     Pulse Rate 10/23/17 1521 65     Resp 10/23/17 1521 16     Temp 10/23/17 1521 98.7 F (37.1 C)     Temp Source 10/23/17 1521 Oral     SpO2 10/23/17 1521 97 %     Weight 10/23/17 1522 220 lb (99.8 kg)     Height 10/23/17 1522 5\' 9"  (1.753 m)     Head  Circumference --      Peak Flow --      Pain Score 10/23/17 1522 0     Pain Loc --      Pain Edu? --      Excl. in [place]? --    No data found.  Updated Vital Signs BP 114/74 (BP Location: Right Arm)   Pulse 65   Temp 98.7 F (37.1 C) (Oral)   Resp 16   Ht 5\' 9"  (1.753 m)   Wt 220 lb (99.8 kg)   SpO2 97%   BMI 32.49 kg/m   Visual Acuity Right Eye Distance:   Left Eye Distance:   Bilateral Distance:    Right Eye Near:   Left Eye Near:    Bilateral Near:     Physical Exam  Constitutional: He appears well-developed and well-nourished. No distress.  HENT:  Head: Normocephalic.  Right Ear: Tympanic  membrane, external ear and ear canal normal.  Left Ear: Tympanic membrane, external ear and ear canal normal.  Nose: Nose normal.  Mouth/Throat: Oropharynx is clear and moist.  Eyes: Pupils are equal, round, and reactive to light. Conjunctivae, EOM and lids are normal. Right eye exhibits no chemosis, no discharge, no exudate and no hordeolum. Left eye exhibits no chemosis, no discharge, no exudate and no hordeolum.  Neck: Neck supple.  Cardiovascular: Normal rate.  Pulmonary/Chest: Effort normal.  Lymphadenopathy:    He has no cervical adenopathy.  Neurological: He is alert.  Skin: Skin is warm and dry. No rash noted.  Nursing note and vitals reviewed.    UC Treatments / Results  Labs (all labs ordered are listed, but only abnormal results are displayed) Labs Reviewed - No data to display  EKG None Radiology No results found.  Procedures Procedures (including critical care time)  Medications Ordered in UC Medications - No data to display   Initial Impression / Assessment and Plan / UC Course  I have reviewed the triage vital signs and the nursing notes.  Pertinent labs & imaging results that were available during my care of the patient were reviewed by me and considered in my medical decision making (see chart for details).    Begin Pataday ophthalmic solution. Continue daily antihistamine. Followup with Family Doctor if not improved in one week.     Final Clinical Impressions(s) / UC Diagnoses   Final diagnoses:  Allergic conjunctivitis of both eyes    ED Discharge Orders        Ordered    Olopatadine HCl 0.2 % SOLN     10/23/17 1627          Kandra Nicolas, MD 10/28/17 2051

## 2018-02-01 ENCOUNTER — Other Ambulatory Visit: Payer: Self-pay | Admitting: Family Medicine

## 2018-06-15 DIAGNOSIS — D2262 Melanocytic nevi of left upper limb, including shoulder: Secondary | ICD-10-CM | POA: Diagnosis not present

## 2018-06-15 DIAGNOSIS — L720 Epidermal cyst: Secondary | ICD-10-CM | POA: Diagnosis not present

## 2018-06-15 DIAGNOSIS — D2261 Melanocytic nevi of right upper limb, including shoulder: Secondary | ICD-10-CM | POA: Diagnosis not present

## 2018-06-15 DIAGNOSIS — L814 Other melanin hyperpigmentation: Secondary | ICD-10-CM | POA: Diagnosis not present

## 2018-06-15 DIAGNOSIS — D1801 Hemangioma of skin and subcutaneous tissue: Secondary | ICD-10-CM | POA: Diagnosis not present

## 2018-06-15 DIAGNOSIS — D225 Melanocytic nevi of trunk: Secondary | ICD-10-CM | POA: Diagnosis not present

## 2018-06-15 DIAGNOSIS — D2239 Melanocytic nevi of other parts of face: Secondary | ICD-10-CM | POA: Diagnosis not present

## 2018-08-14 ENCOUNTER — Other Ambulatory Visit: Payer: Self-pay | Admitting: Family Medicine

## 2018-09-02 ENCOUNTER — Telehealth: Payer: Self-pay | Admitting: Family Medicine

## 2018-09-02 DIAGNOSIS — Z13220 Encounter for screening for lipoid disorders: Secondary | ICD-10-CM

## 2018-09-02 DIAGNOSIS — E039 Hypothyroidism, unspecified: Secondary | ICD-10-CM

## 2018-09-02 DIAGNOSIS — R748 Abnormal levels of other serum enzymes: Secondary | ICD-10-CM

## 2018-09-02 DIAGNOSIS — Z79899 Other long term (current) drug therapy: Secondary | ICD-10-CM

## 2018-09-02 NOTE — Telephone Encounter (Signed)
Pt has CPE scheduled for 3/5 he would like to have lab work done before appt. He also would like [place] to send the orders to [place] center so he could go there and have them drawn.   CB# [phone]

## 2018-09-02 NOTE — Telephone Encounter (Signed)
Last had ferritin,iron,hep c,hep b. Please advise.

## 2018-09-02 NOTE — Telephone Encounter (Signed)
Lip liv m7 cbc tsh

## 2018-09-02 NOTE — Telephone Encounter (Signed)
Labs placed and pt is aware . He will call the place he is wanting to have his labs drawn at and will call me with their fax #.Also mailed to the patient .

## 2018-09-02 NOTE — Telephone Encounter (Signed)
Mailed the order to patient with confirmation sheet that we faxed it.

## 2018-09-03 DIAGNOSIS — Z79899 Other long term (current) drug therapy: Secondary | ICD-10-CM | POA: Diagnosis not present

## 2018-09-03 DIAGNOSIS — E039 Hypothyroidism, unspecified: Secondary | ICD-10-CM | POA: Diagnosis not present

## 2018-09-03 DIAGNOSIS — Z13220 Encounter for screening for lipoid disorders: Secondary | ICD-10-CM | POA: Diagnosis not present

## 2018-09-03 DIAGNOSIS — R748 Abnormal levels of other serum enzymes: Secondary | ICD-10-CM | POA: Diagnosis not present

## 2018-09-04 LAB — CBC WITH DIFFERENTIAL/PLATELET
BASOS: 1 %
Basophils Absolute: 0 10*3/uL (ref 0.0–0.2)
EOS (ABSOLUTE): 0.1 10*3/uL (ref 0.0–0.4)
EOS: 2 %
HEMATOCRIT: 46.2 % (ref 37.5–51.0)
Hemoglobin: 15.4 g/dL (ref 13.0–17.7)
Immature Grans (Abs): 0 10*3/uL (ref 0.0–0.1)
Immature Granulocytes: 0 %
LYMPHS ABS: 1.6 10*3/uL (ref 0.7–3.1)
Lymphs: 34 %
MCH: 31.3 pg (ref 26.6–33.0)
MCHC: 33.3 g/dL (ref 31.5–35.7)
MCV: 94 fL (ref 79–97)
MONOS ABS: 0.5 10*3/uL (ref 0.1–0.9)
Monocytes: 10 %
Neutrophils Absolute: 2.6 10*3/uL (ref 1.4–7.0)
Neutrophils: 53 %
PLATELETS: 191 10*3/uL (ref 150–450)
RBC: 4.92 x10E6/uL (ref 4.14–5.80)
RDW: 12.7 % (ref 11.6–15.4)
WBC: 4.7 10*3/uL (ref 3.4–10.8)

## 2018-09-04 LAB — BASIC METABOLIC PANEL
BUN/Creatinine Ratio: 11 (ref 9–20)
BUN: 12 mg/dL (ref 6–24)
CO2: 25 mmol/L (ref 20–29)
Calcium: 9.1 mg/dL (ref 8.7–10.2)
Chloride: 103 mmol/L (ref 96–106)
Creatinine, Ser: 1.1 mg/dL (ref 0.76–1.27)
GFR, EST AFRICAN AMERICAN: 92 mL/min/{1.73_m2} (ref >59)
GFR, EST NON AFRICAN AMERICAN: 80 mL/min/{1.73_m2} (ref >59)
Glucose: 92 mg/dL (ref 65–99)
POTASSIUM: 4.6 mmol/L (ref 3.5–5.2)
SODIUM: 141 mmol/L (ref 134–144)

## 2018-09-04 LAB — LIPID PANEL
Chol/HDL Ratio: 6.6 ratio — ABNORMAL HIGH (ref 0.0–5.0)
Cholesterol, Total: 217 mg/dL — ABNORMAL HIGH (ref 100–199)
HDL: 33 mg/dL — ABNORMAL LOW (ref >39)
LDL CALC: 140 mg/dL — AB (ref 0–99)
Triglycerides: 220 mg/dL — ABNORMAL HIGH (ref 0–149)
VLDL CHOLESTEROL CAL: 44 mg/dL — AB (ref 5–40)

## 2018-09-04 LAB — TSH: TSH: 3.38 u[IU]/mL (ref 0.450–4.500)

## 2018-09-04 LAB — HEPATIC FUNCTION PANEL
ALT: 43 IU/L (ref 0–44)
AST: 42 IU/L — AB (ref 0–40)
Albumin: 4.9 g/dL (ref 4.0–5.0)
Alkaline Phosphatase: 58 IU/L (ref 39–117)
BILIRUBIN TOTAL: 0.6 mg/dL (ref 0.0–1.2)
BILIRUBIN, DIRECT: 0.11 mg/dL (ref 0.00–0.40)
Total Protein: 7 g/dL (ref 6.0–8.5)

## 2018-09-09 ENCOUNTER — Ambulatory Visit (INDEPENDENT_AMBULATORY_CARE_PROVIDER_SITE_OTHER): Payer: 59 | Admitting: Family Medicine

## 2018-09-09 ENCOUNTER — Encounter: Payer: Self-pay | Admitting: Family Medicine

## 2018-09-09 VITALS — BP 128/72 | Ht 69.0 in | Wt 217.4 lb

## 2018-09-09 DIAGNOSIS — Z0000 Encounter for general adult medical examination without abnormal findings: Secondary | ICD-10-CM | POA: Diagnosis not present

## 2018-09-09 DIAGNOSIS — E785 Hyperlipidemia, unspecified: Secondary | ICD-10-CM

## 2018-09-09 DIAGNOSIS — E039 Hypothyroidism, unspecified: Secondary | ICD-10-CM

## 2018-09-09 DIAGNOSIS — R748 Abnormal levels of other serum enzymes: Secondary | ICD-10-CM | POA: Diagnosis not present

## 2018-09-09 MED ORDER — LEVOTHYROXINE SODIUM 75 MCG PO TABS: ORAL_TABLET | ORAL | 3 refills | Status: DC

## 2018-09-09 NOTE — Patient Instructions (Signed)
Results for orders placed or performed in visit on 09/02/18  Hepatic function panel  Result Value Ref Range   Total Protein 7.0 6.0 - 8.5 g/dL   Albumin 4.9 4.0 - 5.0 g/dL   Bilirubin Total 0.6 0.0 - 1.2 mg/dL   Bilirubin, Direct 0.11 0.00 - 0.40 mg/dL   Alkaline Phosphatase 58 39 - 117 IU/L   AST 42 (H) 0 - 40 IU/L   ALT 43 0 - 44 IU/L  Lipid panel  Result Value Ref Range   Cholesterol, Total 217 (H) 100 - 199 mg/dL   Triglycerides 220 (H) 0 - 149 mg/dL   HDL 33 (L) >39 mg/dL   VLDL Cholesterol Cal 44 (H) 5 - 40 mg/dL   LDL Calculated 140 (H) 0 - 99 mg/dL   Chol/HDL Ratio 6.6 (H) 0.0 - 5.0 ratio  Basic metabolic panel  Result Value Ref Range   Glucose 92 65 - 99 mg/dL   BUN 12 6 - 24 mg/dL   Creatinine, Ser 1.10 0.76 - 1.27 mg/dL   GFR calc non Af Amer 80 >59 mL/min/1.73   GFR calc Af Amer 92 >59 mL/min/1.73   BUN/Creatinine Ratio 11 9 - 20   Sodium 141 134 - 144 mmol/L   Potassium 4.6 3.5 - 5.2 mmol/L   Chloride 103 96 - 106 mmol/L   CO2 25 20 - 29 mmol/L   Calcium 9.1 8.7 - 10.2 mg/dL  TSH  Result Value Ref Range   TSH 3.380 0.450 - 4.500 uIU/mL  CBC with Differential/Platelet  Result Value Ref Range   WBC 4.7 3.4 - 10.8 x10E3/uL   RBC 4.92 4.14 - 5.80 x10E6/uL   Hemoglobin 15.4 13.0 - 17.7 g/dL   Hematocrit 46.2 37.5 - 51.0 %   MCV 94 79 - 97 fL   MCH 31.3 26.6 - 33.0 pg   MCHC 33.3 31.5 - 35.7 g/dL   RDW 12.7 11.6 - 15.4 %   Platelets 191 150 - 450 x10E3/uL   Neutrophils 53 Not Estab. %   Lymphs 34 Not Estab. %   Monocytes 10 Not Estab. %   Eos 2 Not Estab. %   Basos 1 Not Estab. %   Neutrophils Absolute 2.6 1.4 - 7.0 x10E3/uL   Lymphocytes Absolute 1.6 0.7 - 3.1 x10E3/uL   Monocytes Absolute 0.5 0.1 - 0.9 x10E3/uL   EOS (ABSOLUTE) 0.1 0.0 - 0.4 x10E3/uL   Basophils Absolute 0.0 0.0 - 0.2 x10E3/uL   Immature Granulocytes 0 Not Estab. %   Immature Grans (Abs) 0.0 0.0 - 0.1 x10E3/uL

## 2018-09-09 NOTE — Progress Notes (Signed)
Subjective:    Patient ID: Brandon Williams, male    DOB: 1971-01-21, 47 y.o.   MRN: [id number]  HPI The patient comes in today for a wellness visit.    A review of their health history was completed.  A review of medications was also completed.  Any needed refills; levothyroxine (90 day supply)  Eating habits: fine; eats to much  Falls/  MVA accidents in past few months: none  Regular exercise: walk dogs 3-4 miles 3-5 days/week  Specialist pt sees on regular basis: no  Preventative health issues were discussed.   Additional concerns: none  Results for orders placed or performed in visit on 09/02/18  Hepatic function panel  Result Value Ref Range   Total Protein 7.0 6.0 - 8.5 g/dL   Albumin 4.9 4.0 - 5.0 g/dL   Bilirubin Total 0.6 0.0 - 1.2 mg/dL   Bilirubin, Direct 0.11 0.00 - 0.40 mg/dL   Alkaline Phosphatase 58 39 - 117 IU/L   AST 42 (H) 0 - 40 IU/L   ALT 43 0 - 44 IU/L  Lipid panel  Result Value Ref Range   Cholesterol, Total 217 (H) 100 - 199 mg/dL   Triglycerides 220 (H) 0 - 149 mg/dL   HDL 33 (L) >39 mg/dL   VLDL Cholesterol Cal 44 (H) 5 - 40 mg/dL   LDL Calculated 140 (H) 0 - 99 mg/dL   Chol/HDL Ratio 6.6 (H) 0.0 - 5.0 ratio  Basic metabolic panel  Result Value Ref Range   Glucose 92 65 - 99 mg/dL   BUN 12 6 - 24 mg/dL   Creatinine, Ser 1.10 0.76 - 1.27 mg/dL   GFR calc non Af Amer 80 >59 mL/min/1.73   GFR calc Af Amer 92 >59 mL/min/1.73   BUN/Creatinine Ratio 11 9 - 20   Sodium 141 134 - 144 mmol/L   Potassium 4.6 3.5 - 5.2 mmol/L   Chloride 103 96 - 106 mmol/L   CO2 25 20 - 29 mmol/L   Calcium 9.1 8.7 - 10.2 mg/dL  TSH  Result Value Ref Range   TSH 3.380 0.450 - 4.500 uIU/mL  CBC with Differential/Platelet  Result Value Ref Range   WBC 4.7 3.4 - 10.8 x10E3/uL   RBC 4.92 4.14 - 5.80 x10E6/uL   Hemoglobin 15.4 13.0 - 17.7 g/dL   Hematocrit 46.2 37.5 - 51.0 %   MCV 94 79 - 97 fL   MCH 31.3 26.6 - 33.0 pg   MCHC 33.3 31.5 - 35.7 g/dL   RDW  12.7 11.6 - 15.4 %   Platelets 191 150 - 450 x10E3/uL   Neutrophils 53 Not Estab. %   Lymphs 34 Not Estab. %   Monocytes 10 Not Estab. %   Eos 2 Not Estab. %   Basos 1 Not Estab. %   Neutrophils Absolute 2.6 1.4 - 7.0 x10E3/uL   Lymphocytes Absolute 1.6 0.7 - 3.1 x10E3/uL   Monocytes Absolute 0.5 0.1 - 0.9 x10E3/uL   EOS (ABSOLUTE) 0.1 0.0 - 0.4 x10E3/uL   Basophils Absolute 0.0 0.0 - 0.2 x10E3/uL   Immature Granulocytes 0 Not Estab. %   Immature Grans (Abs) 0.0 0.0 - 0.1 x10E3/uL   Sticking with thy med, compliant with it  tking dogs for a walk thre to five d per wk    Review of Systems  Constitutional: Negative for activity change, appetite change and fever.  HENT: Negative for congestion and rhinorrhea.   Eyes: Negative for discharge.  Respiratory: Negative  for cough and wheezing.   Cardiovascular: Negative for chest pain.  Gastrointestinal: Negative for abdominal pain, blood in stool and vomiting.  Genitourinary: Negative for difficulty urinating and frequency.  Musculoskeletal: Negative for neck pain.  Skin: Negative for rash.  Allergic/Immunologic: Negative for environmental allergies and food allergies.  Neurological: Negative for weakness and headaches.  Psychiatric/Behavioral: Negative for agitation.  All other systems reviewed and are negative.      Objective:   Physical Exam Vitals signs reviewed.  Constitutional:      Appearance: He is well-developed.  HENT:     Head: Normocephalic and atraumatic.     Right Ear: External ear normal.     Left Ear: External ear normal.     Nose: Nose normal.  Eyes:     Pupils: Pupils are equal, round, and reactive to light.  Neck:     Musculoskeletal: Normal range of motion and neck supple.     Thyroid: No thyromegaly.  Cardiovascular:     Rate and Rhythm: Normal rate and regular rhythm.     Heart sounds: Normal heart sounds. No murmur.  Pulmonary:     Effort: Pulmonary effort is normal. No respiratory distress.      Breath sounds: Normal breath sounds. No wheezing.  Abdominal:     General: Bowel sounds are normal. There is no distension.     Palpations: Abdomen is soft. There is no mass.     Tenderness: There is no abdominal tenderness.  Genitourinary:    Penis: Normal.      Comments: Prostate within normal limits Musculoskeletal: Normal range of motion.  Lymphadenopathy:     Cervical: No cervical adenopathy.  Skin:    General: Skin is warm and dry.     Findings: No erythema.  Neurological:     Mental Status: He is alert.     Motor: No abnormal muscle tone.  Psychiatric:        Behavior: Behavior normal.        Judgment: Judgment normal.           Assessment & Plan:  Impression wellness exam.  Diet discussed.  Exercise discussed.  Vaccinations discussed blood work reviewed.  2.  Elevated liver enzymes.  Substantially improved discussed.  Due to fatty liver.  3.  Hypothyroidism.  TSH excellent.  Maintain same dose for the next year.  Refills written  4.  Hyperlipidemia.  More concerning in regards to patient's family history of heart disease including a father who recently face serious cardiac issues.  Discussion held.  Recommend follow-up visit to document to this and improve the patient's chances long-term.

## 2018-09-11 DIAGNOSIS — E039 Hypothyroidism, unspecified: Secondary | ICD-10-CM | POA: Insufficient documentation

## 2018-09-11 DIAGNOSIS — E785 Hyperlipidemia, unspecified: Secondary | ICD-10-CM | POA: Insufficient documentation

## 2018-10-01 ENCOUNTER — Encounter: Payer: Self-pay | Admitting: Family Medicine

## 2018-10-01 ENCOUNTER — Other Ambulatory Visit: Payer: Self-pay

## 2018-10-01 ENCOUNTER — Ambulatory Visit: Payer: 59 | Admitting: Family Medicine

## 2018-10-01 VITALS — BP 118/70 | Ht 69.0 in | Wt 218.0 lb

## 2018-10-01 DIAGNOSIS — E785 Hyperlipidemia, unspecified: Secondary | ICD-10-CM

## 2018-10-01 DIAGNOSIS — Z79899 Other long term (current) drug therapy: Secondary | ICD-10-CM

## 2018-10-01 MED ORDER — ATORVASTATIN CALCIUM 10 MG PO TABS: 10.0000 mg | ORAL_TABLET | Freq: Every day | ORAL | 5 refills | Status: DC

## 2018-10-01 NOTE — Progress Notes (Signed)
   Subjective:    Patient ID: Brandon Williams, male    DOB: 05/27/1971, 48 y.o.   MRN: [id number]  Hyperlipidemia  This is a new problem. Current antihyperlipidemic treatment includes diet change. Compliance problems: walks the dog for exercise.     pt concerned because of family history. Father has heart disease and also Paternal grandfather had heart disease.   talked about in the past, always disc potential for revisitn  Always ran about thae  Pt states no other concerns today.      Review of Systems No headache, no major weight loss or weight gain, no chest pain no back pain abdominal pain no change in bowel habits complete ROS otherwise negative     Objective:   Physical Exam  Alert vitals stable, NAD. Blood pressure good on repeat. HEENT normal. Lungs clear. Heart regular rate and rhythm.       Assessment & Plan:  Impression hyperlipidemia.  Options discussed.  Patient wishes to go ahead and treat which I think is a good idea.  Lipitor initiated.  Diet discussed.  Screening blood work in several months.

## 2019-04-05 ENCOUNTER — Ambulatory Visit: Payer: 59 | Admitting: Family Medicine

## 2019-04-05 ENCOUNTER — Ambulatory Visit (INDEPENDENT_AMBULATORY_CARE_PROVIDER_SITE_OTHER): Payer: 59 | Admitting: Family Medicine

## 2019-04-05 ENCOUNTER — Encounter: Payer: Self-pay | Admitting: Family Medicine

## 2019-04-05 DIAGNOSIS — G5760 Lesion of plantar nerve, unspecified lower limb: Secondary | ICD-10-CM | POA: Diagnosis not present

## 2019-04-05 DIAGNOSIS — E039 Hypothyroidism, unspecified: Secondary | ICD-10-CM | POA: Diagnosis not present

## 2019-04-05 DIAGNOSIS — E785 Hyperlipidemia, unspecified: Secondary | ICD-10-CM | POA: Diagnosis not present

## 2019-04-05 DIAGNOSIS — Z79899 Other long term (current) drug therapy: Secondary | ICD-10-CM | POA: Diagnosis not present

## 2019-04-05 MED ORDER — LEVOTHYROXINE SODIUM 75 MCG PO TABS: ORAL_TABLET | ORAL | 1 refills | Status: DC

## 2019-04-05 MED ORDER — ATORVASTATIN CALCIUM 10 MG PO TABS: 10.0000 mg | ORAL_TABLET | Freq: Every day | ORAL | 5 refills | Status: DC

## 2019-04-05 NOTE — Progress Notes (Signed)
   Subjective:    Patient ID: Brandon Williams, male    DOB: August 06, 1970, 48 y.o.   MRN: [id number]  Hyperlipidemia This is a chronic problem. The current episode started more than 1 year ago. Treatments tried: lipitor. There are no compliance problems.  Risk factors for coronary artery disease include dyslipidemia.    Patient continues to take lipid medication regularly. No obvious side effects from it. Generally does not miss a dose. Prior blood work results are reviewed with patient. Patient continues to work on fat intake in diet  No side effects  Exercise off and on, walking most days    Review of Systems Virtual Visit via Video Note  I connected with Brandon Williams on 04/05/19 at  9:00 AM EDT by a video enabled telemedicine application and verified that I am speaking with the correct person using two identifiers.  Location: Patient: home Provider: office   I discussed the limitations of evaluation and management by telemedicine and the availability of in person appointments. The patient expressed understanding and agreed to proceed.  History of Present Illness:    Observations/Objective:   Assessment and Plan:   Follow Up Instructions:    I discussed the assessment and treatment plan with the patient. The patient was provided an opportunity to ask questions and all were answered. The patient agreed with the plan and demonstrated an understanding of the instructions.   The patient was advised to call back or seek an in-person evaluation if the symptoms worsen or if the condition fails to improve as anticipated.  I provided 25 minutes of non-face-to-face time during this encounter.  Patient continues to take lipid medication regularly. No obvious side effects from it. Generally does not miss a dose. Prior blood work results are reviewed with patient. Patient continues to work on fat intake in diet   Compliant with thyroid medication.  No symptoms of high or low  thyroid.  Does not miss a dose.  Blood work 6 months ago excellent discussed  Vaccines discussed.  Patient has already had a flu shot  Progressive foot pain.  Describes it in the distal midfoot between between the second and third toe.  Months duration.  Sharp shooting pains.  Has led him to stop exercising substantially.  Discussion held.  Patient agrees to podiatry referral.    Objective:   Physical Exam   Virtual     Assessment & Plan:  Impression hyperlipidemia.  Diet discussed.  Blood work discussed.  Medications refilled.  2.  Hypothyroidism.  Ongoing compliance with meds discussed to maintain  Follow-up in 6 months.  Medications refilled patient has already had flu shot

## 2019-05-18 ENCOUNTER — Ambulatory Visit: Payer: 59 | Admitting: Podiatry

## 2019-05-18 ENCOUNTER — Other Ambulatory Visit: Payer: Self-pay

## 2019-05-18 ENCOUNTER — Other Ambulatory Visit: Payer: Self-pay | Admitting: Podiatry

## 2019-05-18 ENCOUNTER — Ambulatory Visit (INDEPENDENT_AMBULATORY_CARE_PROVIDER_SITE_OTHER): Payer: BC Managed Care – PPO

## 2019-05-18 ENCOUNTER — Encounter: Payer: Self-pay | Admitting: Podiatry

## 2019-05-18 VITALS — BP 130/91 | HR 56 | Resp 16

## 2019-05-18 DIAGNOSIS — M779 Enthesopathy, unspecified: Secondary | ICD-10-CM

## 2019-05-18 DIAGNOSIS — M7752 Other enthesopathy of left foot: Secondary | ICD-10-CM

## 2019-05-18 DIAGNOSIS — D3610 Benign neoplasm of peripheral nerves and autonomic nervous system, unspecified: Secondary | ICD-10-CM

## 2019-05-18 DIAGNOSIS — M79672 Pain in left foot: Secondary | ICD-10-CM

## 2019-05-18 NOTE — Progress Notes (Signed)
   Subjective:    Patient ID: Brandon Williams, male    DOB: 1970/07/18, 48 y.o.   MRN: [id number]  HPI    Review of Systems  All other systems reviewed and are negative.      Objective:   Physical Exam        Assessment & Plan:

## 2019-05-18 NOTE — Progress Notes (Signed)
Subjective:   Patient ID: Brandon Williams, male   DOB: 48 y.o.   MRN: [id number]   HPI Patient states have had quite a bit of pain in the dorsum of the left foot and has been going on for several months and I do not remember specific injury.  Patient states it is worse when he gets up in the morning and after periods of sitting and he does not remember specific injury.  Patient does not smoke likes to be active   Review of Systems  All other systems reviewed and are negative.       Objective:  Physical Exam Vitals signs and nursing note reviewed.  Constitutional:      Appearance: He is well-developed.  Pulmonary:     Effort: Pulmonary effort is normal.  Musculoskeletal: Normal range of motion.  Skin:    General: Skin is warm.  Neurological:     Mental Status: He is alert.     Neurovascular status intact muscle strength found to be adequate range of motion within normal limits with patient found to have inflammation fluid around the fourth MPJ of the left foot that is painful when pressed and makes walking difficult.  Mild discomfort in the interspace with possible palpable nerve but no indications of specific problem.  Patient is found to have good digital perfusion well oriented x3     Assessment:  Probability for inflammatory capsulitis fourth MPJ left     Plan:  H&P conditions reviewed and today I did a sterile prep of the right forefoot after anesthetizing 60 mg like Marcaine mixture and I aspirated the fourth MPJ getting out a small amount of clear fluid injected quarter cc dexamethasone Kenalog quite thick plantar padding to reduce pressure on the joint surface.  Reappoint to recheck and also discussed possible neuroma symptomatology   X-rays indicate no signs of stress fracture arthritis associated with this

## 2019-06-08 ENCOUNTER — Ambulatory Visit: Payer: BC Managed Care – PPO | Admitting: Podiatry

## 2019-06-08 ENCOUNTER — Other Ambulatory Visit: Payer: Self-pay

## 2019-06-08 ENCOUNTER — Encounter: Payer: Self-pay | Admitting: Podiatry

## 2019-06-08 DIAGNOSIS — D3610 Benign neoplasm of peripheral nerves and autonomic nervous system, unspecified: Secondary | ICD-10-CM

## 2019-06-08 DIAGNOSIS — M779 Enthesopathy, unspecified: Secondary | ICD-10-CM | POA: Diagnosis not present

## 2019-06-08 NOTE — Progress Notes (Signed)
Subjective:   Patient ID: Brandon Williams, male   DOB: 48 y.o.   MRN: [id number]   HPI Patient states still having pain and it feels like there is something in there.  States the pain did improve some after the previous treatment but there is something that just bothers him and he cannot do certain forms of activity   ROS      Objective:  Physical Exam  Neurovascular status intact with patient's metatarsal phalangeal joint seemed to be improved but what appears to be a positive Mulder sign with neuroma-like symptomatology with no active burning noted     Assessment:  Difficult to make complete determination between capsulitis with possibility for neuroma-like symptoms     Plan:  H&P and reviewed at great length.  I get a focus on nerve and today I did a proximal nerve block to the entrapped nerve and I did inject with purified alcohol small amount of steroid and Marcaine with epinephrine.  Patient will be seen back 2 weeks and I want to see the results of this and at one point may have to consider excision of the neuroma

## 2019-06-23 ENCOUNTER — Ambulatory Visit: Payer: BC Managed Care – PPO | Admitting: Podiatry

## 2019-06-23 ENCOUNTER — Other Ambulatory Visit: Payer: Self-pay

## 2019-06-23 ENCOUNTER — Encounter: Payer: Self-pay | Admitting: Podiatry

## 2019-06-23 DIAGNOSIS — D3610 Benign neoplasm of peripheral nerves and autonomic nervous system, unspecified: Secondary | ICD-10-CM

## 2019-06-23 NOTE — Progress Notes (Signed)
Subjective:   Patient ID: Brandon Williams, male   DOB: 48 y.o.   MRN: [id number]   HPI Patient stated the last injection gave short-term relief of symptoms but by the evening the foot was throbbing and then it settled down to where it was before and there is not been improvement and it continues to be a full feeling and like there is something in the left foot    ROS      Objective:  Physical Exam  Neurovascular status intact with patient's left third interspace continue to exhibit a positive neuroma signal with what appears to be a nodule that he states seems to be where his pain is     Assessment:  Strong probability that we are dealing with neuroma symptomatology left     Plan:  Reviewed condition at great length discussing options and I do not think continued injection therapy will be of value.  I do think excision of the neuroma will probably be the best long-term waiting for him to go and I did educate him on this and the fact there is no guarantee this will solve the problem.  Patient is willing to accept this risk would like to have this done but is not sure on timing and I did give him my scheduler's number and educated him on neurectomy surgery

## 2019-06-25 ENCOUNTER — Encounter: Payer: Self-pay | Admitting: Family Medicine

## 2019-07-28 LAB — HEPATIC FUNCTION PANEL
ALT: 52 IU/L — ABNORMAL HIGH (ref 0–44)
AST: 51 IU/L — ABNORMAL HIGH (ref 0–40)
Albumin: 4.9 g/dL (ref 4.0–5.0)
Alkaline Phosphatase: 66 IU/L (ref 39–117)
Bilirubin Total: 0.6 mg/dL (ref 0.0–1.2)
Bilirubin, Direct: 0.16 mg/dL (ref 0.00–0.40)
Total Protein: 7.1 g/dL (ref 6.0–8.5)

## 2019-07-28 LAB — LIPID PANEL
Chol/HDL Ratio: 4.4 ratio (ref 0.0–5.0)
Cholesterol, Total: 159 mg/dL (ref 100–199)
HDL: 36 mg/dL — ABNORMAL LOW (ref >39)
LDL Chol Calc (NIH): 94 mg/dL (ref 0–99)
Triglycerides: 165 mg/dL — ABNORMAL HIGH (ref 0–149)
VLDL Cholesterol Cal: 29 mg/dL (ref 5–40)

## 2019-07-31 ENCOUNTER — Encounter: Payer: Self-pay | Admitting: Family Medicine

## 2019-08-10 ENCOUNTER — Encounter: Payer: Self-pay | Admitting: Family Medicine

## 2019-10-23 ENCOUNTER — Other Ambulatory Visit: Payer: Self-pay | Admitting: Family Medicine

## 2019-10-24 ENCOUNTER — Other Ambulatory Visit: Payer: Self-pay | Admitting: *Deleted

## 2019-10-24 DIAGNOSIS — Z125 Encounter for screening for malignant neoplasm of prostate: Secondary | ICD-10-CM

## 2019-10-24 DIAGNOSIS — E039 Hypothyroidism, unspecified: Secondary | ICD-10-CM

## 2019-10-24 DIAGNOSIS — Z79899 Other long term (current) drug therapy: Secondary | ICD-10-CM

## 2019-10-24 DIAGNOSIS — E785 Hyperlipidemia, unspecified: Secondary | ICD-10-CM

## 2019-10-24 NOTE — Telephone Encounter (Signed)
Ok times three mo, contact pt rec chronic ov plus wellness

## 2019-10-24 NOTE — Telephone Encounter (Signed)
Labs orders put in and left pt a message to return call to let him know to do labs. Will need to send in refills when he calls back because message will go away after sending them in so will wait til he calls back

## 2019-10-24 NOTE — Telephone Encounter (Signed)
CPE scheduled 5/21.   Pt would like to have lab work done

## 2019-10-24 NOTE — Telephone Encounter (Signed)
Lip liv m7 psa  May ref times three mo

## 2019-10-27 NOTE — Telephone Encounter (Signed)
Pt stopped by office and told about lab work

## 2019-11-18 LAB — HEPATIC FUNCTION PANEL
ALT: 66 IU/L — ABNORMAL HIGH (ref 0–44)
AST: 57 IU/L — ABNORMAL HIGH (ref 0–40)
Albumin: 4.9 g/dL (ref 4.0–5.0)
Alkaline Phosphatase: 70 IU/L (ref 39–117)
Bilirubin Total: 0.7 mg/dL (ref 0.0–1.2)
Bilirubin, Direct: 0.17 mg/dL (ref 0.00–0.40)
Total Protein: 7.3 g/dL (ref 6.0–8.5)

## 2019-11-18 LAB — TSH: TSH: 3.4 u[IU]/mL (ref 0.450–4.500)

## 2019-11-18 LAB — LIPID PANEL
Chol/HDL Ratio: 4.4 ratio (ref 0.0–5.0)
Cholesterol, Total: 164 mg/dL (ref 100–199)
HDL: 37 mg/dL — ABNORMAL LOW (ref >39)
LDL Chol Calc (NIH): 96 mg/dL (ref 0–99)
Triglycerides: 179 mg/dL — ABNORMAL HIGH (ref 0–149)
VLDL Cholesterol Cal: 31 mg/dL (ref 5–40)

## 2019-11-18 LAB — BASIC METABOLIC PANEL
BUN/Creatinine Ratio: 12 (ref 9–20)
BUN: 13 mg/dL (ref 6–24)
CO2: 25 mmol/L (ref 20–29)
Calcium: 9.2 mg/dL (ref 8.7–10.2)
Chloride: 101 mmol/L (ref 96–106)
Creatinine, Ser: 1.06 mg/dL (ref 0.76–1.27)
GFR calc Af Amer: 95 mL/min/{1.73_m2} (ref >59)
GFR calc non Af Amer: 83 mL/min/{1.73_m2} (ref >59)
Glucose: 109 mg/dL — ABNORMAL HIGH (ref 65–99)
Potassium: 4.8 mmol/L (ref 3.5–5.2)
Sodium: 141 mmol/L (ref 134–144)

## 2019-11-18 LAB — PSA: Prostate Specific Ag, Serum: 1 ng/mL (ref 0.0–4.0)

## 2019-11-25 ENCOUNTER — Ambulatory Visit (INDEPENDENT_AMBULATORY_CARE_PROVIDER_SITE_OTHER): Payer: BC Managed Care – PPO | Admitting: Family Medicine

## 2019-11-25 ENCOUNTER — Other Ambulatory Visit: Payer: Self-pay

## 2019-11-25 ENCOUNTER — Encounter: Payer: Self-pay | Admitting: Family Medicine

## 2019-11-25 VITALS — BP 138/86 | Temp 97.6°F | Ht 69.0 in | Wt 220.0 lb

## 2019-11-25 DIAGNOSIS — Z0000 Encounter for general adult medical examination without abnormal findings: Secondary | ICD-10-CM | POA: Diagnosis not present

## 2019-11-25 DIAGNOSIS — Z1211 Encounter for screening for malignant neoplasm of colon: Secondary | ICD-10-CM | POA: Diagnosis not present

## 2019-11-25 DIAGNOSIS — E785 Hyperlipidemia, unspecified: Secondary | ICD-10-CM | POA: Diagnosis not present

## 2019-11-25 MED ORDER — ATORVASTATIN CALCIUM 10 MG PO TABS: 10.0000 mg | ORAL_TABLET | Freq: Every day | ORAL | 1 refills | Status: DC

## 2019-11-25 MED ORDER — LEVOTHYROXINE SODIUM 75 MCG PO TABS: ORAL_TABLET | ORAL | 3 refills | Status: DC

## 2019-11-25 NOTE — Progress Notes (Signed)
Subjective:    Patient ID: Brandon Williams, male    DOB: 27-Sep-1970, 49 y.o.   MRN: [id number]  HPI The patient comes in today for a wellness visit.    A review of their health history was completed.  A review of medications was also completed.  Any needed refills; update meds  Eating habits: health conscious  Falls/  MVA accidents in past few months: none  Regular exercise: walks dog 1-2 miles 3 -4 times per week.   Specialist pt sees on regular basis:  Preventative health issues were discussed.   Additional concerns:   Results for orders placed or performed in visit on 10/24/19  Hepatic function panel  Result Value Ref Range   Total Protein 7.3 6.0 - 8.5 g/dL   Albumin 4.9 4.0 - 5.0 g/dL   Bilirubin Total 0.7 0.0 - 1.2 mg/dL   Bilirubin, Direct 0.17 0.00 - 0.40 mg/dL   Alkaline Phosphatase 70 39 - 117 IU/L   AST 57 (H) 0 - 40 IU/L   ALT 66 (H) 0 - 44 IU/L  Lipid panel  Result Value Ref Range   Cholesterol, Total 164 100 - 199 mg/dL   Triglycerides 179 (H) 0 - 149 mg/dL   HDL 37 (L) >39 mg/dL   VLDL Cholesterol Cal 31 5 - 40 mg/dL   LDL Chol Calc (NIH) 96 0 - 99 mg/dL   Chol/HDL Ratio 4.4 0.0 - 5.0 ratio  Basic metabolic panel  Result Value Ref Range   Glucose 109 (H) 65 - 99 mg/dL   BUN 13 6 - 24 mg/dL   Creatinine, Ser 1.06 0.76 - 1.27 mg/dL   GFR calc non Af Amer 83 >59 mL/min/1.73   GFR calc Af Amer 95 >59 mL/min/1.73   BUN/Creatinine Ratio 12 9 - 20   Sodium 141 134 - 144 mmol/L   Potassium 4.8 3.5 - 5.2 mmol/L   Chloride 101 96 - 106 mmol/L   CO2 25 20 - 29 mmol/L   Calcium 9.2 8.7 - 10.2 mg/dL  PSA  Result Value Ref Range   Prostate Specific Ag, Serum 1.0 0.0 - 4.0 ng/mL  TSH  Result Value Ref Range   TSH 3.400 0.450 - 4.500 uIU/mL     Review of Systems No headache, no major weight loss or weight gain, no chest pain no back pain abdominal pain no change in bowel habits complete ROS otherwise negative     Objective:   Physical  Exam Vitals reviewed.  Constitutional:      Appearance: He is well-developed.  HENT:     Head: Normocephalic and atraumatic.     Right Ear: External ear normal.     Left Ear: External ear normal.     Nose: Nose normal.  Eyes:     Pupils: Pupils are equal, round, and reactive to light.  Neck:     Thyroid: No thyromegaly.  Cardiovascular:     Rate and Rhythm: Normal rate and regular rhythm.     Heart sounds: Normal heart sounds. No murmur.  Pulmonary:     Effort: Pulmonary effort is normal. No respiratory distress.     Breath sounds: Normal breath sounds. No wheezing.  Abdominal:     General: Bowel sounds are normal. There is no distension.     Palpations: Abdomen is soft. There is no mass.     Tenderness: There is no abdominal tenderness.  Genitourinary:    Penis: Normal.   Musculoskeletal:  General: Normal range of motion.     Cervical back: Normal range of motion and neck supple.  Lymphadenopathy:     Cervical: No cervical adenopathy.  Skin:    General: Skin is warm and dry.     Findings: No erythema.  Neurological:     Mental Status: He is alert.     Motor: No abnormal muscle tone.  Psychiatric:        Behavior: Behavior normal.        Judgment: Judgment normal.    Prostate exam within normal limits.       Assessment & Plan:  Impression 1 wellness exam.  Diet discussed.  Exercise discussed.  Blood work discussed.  Elevated glucose reveals prediabetes.  Hyperlipidemia good control.  Thyroid good control.  Exercise encouraged.  Colonoscopy scheduled.  Follow-up in 6 months.  Patient does live about 40 minutes from here and may take his primary care closer to home

## 2019-12-14 ENCOUNTER — Encounter: Payer: Self-pay | Admitting: Family Medicine

## 2020-01-11 ENCOUNTER — Encounter: Payer: Self-pay | Admitting: Internal Medicine

## 2020-02-23 ENCOUNTER — Encounter: Payer: Self-pay | Admitting: Internal Medicine

## 2020-02-23 ENCOUNTER — Telehealth: Payer: Self-pay | Admitting: *Deleted

## 2020-02-23 ENCOUNTER — Ambulatory Visit (AMBULATORY_SURGERY_CENTER): Payer: Self-pay | Admitting: *Deleted

## 2020-02-23 ENCOUNTER — Other Ambulatory Visit: Payer: Self-pay

## 2020-02-23 VITALS — Ht 69.0 in | Wt 219.0 lb

## 2020-02-23 DIAGNOSIS — Z1211 Encounter for screening for malignant neoplasm of colon: Secondary | ICD-10-CM

## 2020-02-23 MED ORDER — SUTAB 1479-225-188 MG PO TABS: 1.0000 | ORAL_TABLET | Freq: Once | ORAL | 0 refills | Status: AC

## 2020-02-23 NOTE — Progress Notes (Addendum)
No egg or soy allergy known to patient  No issues with past sedation with any surgeries or procedures no intubation problems in the past    FH of Malignant Hyperthermia, paternal grandmother and uncle  No diet pills per patient No home 02 use per patient  No blood thinners per patient  Pt denies issues with constipation  No A fib or A flutter  EMMI video to pt or via [place] 19 guidelines implemented in [employer] today with Pt and RN    Coupon given to pt in [facility] today , Code to Pharmacy   Due to the COVID-19 pandemic we are asking patients to follow these guidelines. Please only bring one care partner. Please be aware that your care partner may wait in the car in the parking lot or if they feel like they will be too hot to wait in the car, they may wait in the lobby on the 4th floor. All care partners are required to wear a mask the entire time (we do not have any that we can provide them), they need to practice social distancing, and we will do a Covid check for all patient's and care partners when you arrive. Also we will check their temperature and your temperature. If the care partner waits in their car they need to stay in the parking lot the entire time and we will call them on their cell phone when the patient is ready for discharge so they can bring the car to the front of the building. Also all patient's will need to wear a mask into building.

## 2020-02-23 NOTE — Telephone Encounter (Signed)
Patient has family history of Malignant Hyperthermia, paternal grandmother and uncle died in OR with MH.

## 2020-02-23 NOTE — Telephone Encounter (Signed)
I would ask Brandon Williams to be certain, but not sure same issues with propofol as general anesthesia.

## 2020-02-27 NOTE — Telephone Encounter (Signed)
Dr. Henrene Pastor,  This pt is cleared for anesthetic care at [facility].  Thanks,  Osvaldo Angst

## 2020-03-08 ENCOUNTER — Ambulatory Visit (AMBULATORY_SURGERY_CENTER): Payer: BC Managed Care – PPO | Admitting: Internal Medicine

## 2020-03-08 ENCOUNTER — Encounter: Payer: Self-pay | Admitting: Internal Medicine

## 2020-03-08 ENCOUNTER — Other Ambulatory Visit: Payer: Self-pay

## 2020-03-08 VITALS — BP 118/79 | HR 66 | Temp 97.7°F | Resp 14 | Ht 69.0 in | Wt 219.0 lb

## 2020-03-08 DIAGNOSIS — D122 Benign neoplasm of ascending colon: Secondary | ICD-10-CM | POA: Diagnosis not present

## 2020-03-08 DIAGNOSIS — D123 Benign neoplasm of transverse colon: Secondary | ICD-10-CM | POA: Diagnosis not present

## 2020-03-08 DIAGNOSIS — Z1211 Encounter for screening for malignant neoplasm of colon: Secondary | ICD-10-CM | POA: Diagnosis present

## 2020-03-08 MED ORDER — SODIUM CHLORIDE 0.9 % IV SOLN: 500.0000 mL | Freq: Once | INTRAVENOUS | Status: DC

## 2020-03-08 NOTE — Patient Instructions (Signed)
Handout on polyps given. ° °YOU HAD AN ENDOSCOPIC PROCEDURE TODAY AT THE  ENDOSCOPY CENTER:   Refer to the procedure report that was given to you for any specific questions about what was found during the examination.  If the procedure report does not answer your questions, please call your gastroenterologist to clarify.  If you requested that your care partner not be given the details of your procedure findings, then the procedure report has been included in a sealed envelope for you to review at your convenience later. ° °YOU SHOULD EXPECT: Some feelings of bloating in the abdomen. Passage of more gas than usual.  Walking can help get rid of the air that was put into your GI tract during the procedure and reduce the bloating. If you had a lower endoscopy (such as a colonoscopy or flexible sigmoidoscopy) you may notice spotting of blood in your stool or on the toilet paper. If you underwent a bowel prep for your procedure, you may not have a normal bowel movement for a few days. ° °Please Note:  You might notice some irritation and congestion in your nose or some drainage.  This is from the oxygen used during your procedure.  There is no need for concern and it should clear up in a day or so. ° °SYMPTOMS TO REPORT IMMEDIATELY: ° °Following lower endoscopy (colonoscopy or flexible sigmoidoscopy): ° Excessive amounts of blood in the stool ° Significant tenderness or worsening of abdominal pains ° Swelling of the abdomen that is new, acute ° Fever of 100°F or higher ° °For urgent or emergent issues, a gastroenterologist can be reached at any hour by calling [phone]. °Do not use MyChart messaging for urgent concerns.  ° ° °DIET:  We do recommend a small meal at first, but then you may proceed to your regular diet.  Drink plenty of fluids but you should avoid alcoholic beverages for 24 hours. ° °ACTIVITY:  You should plan to take it easy for the rest of today and you should NOT DRIVE or use heavy machinery  until tomorrow (because of the sedation medicines used during the test).   ° °FOLLOW UP: °Our staff will call the number listed on your records 48-72 hours following your procedure to check on you and address any questions or concerns that you may have regarding the information given to you following your procedure. If we do not reach you, we will leave a message.  We will attempt to reach you two times.  During this call, we will ask if you have developed any symptoms of COVID 19. If you develop any symptoms (ie: fever, flu-like symptoms, shortness of breath, cough etc.) before then, please call [phone].  If you test positive for Covid 19 in the 2 weeks post procedure, please call and report this information to us.   ° °If any biopsies were taken you will be contacted by phone or by letter within the next 1-3 weeks.  Please call us at [phone] if you have not heard about the biopsies in 3 weeks.  ° ° °SIGNATURES/CONFIDENTIALITY: °You and/or your care partner have signed paperwork which will be entered into your electronic medical record.  These signatures attest to the fact that that the information above on your After Visit Summary has been reviewed and is understood.  Full responsibility of the confidentiality of this discharge information lies with you and/or your care-partner.  °

## 2020-03-08 NOTE — Progress Notes (Signed)
Pt's states no medical or surgical changes since previsit or office visit. 

## 2020-03-08 NOTE — Op Note (Signed)
[facility] Patient Name: Brandon Williams Procedure Date: 03/08/2020 11:19 AM MRN: [id number] Endoscopist: Docia Chuck. Henrene Pastor , MD Age: 49 Referring MD:  Date of Birth: [birth year] Gender: Male Account #: [id number] Procedure:                Colonoscopy with cold snare polypectomy x 2 Indications:              Screening for colorectal malignant neoplasm Medicines:                Monitored Anesthesia Care Procedure:                Pre-Anesthesia Assessment:                           - Prior to the procedure, a History and Physical                            was performed, and patient medications and                            allergies were reviewed. The patient's tolerance of                            previous anesthesia was also reviewed. The risks                            and benefits of the procedure and the sedation                            options and risks were discussed with the patient.                            All questions were answered, and informed consent                            was obtained. Prior Anticoagulants: The patient has                            taken no previous anticoagulant or antiplatelet                            agents. ASA Grade Assessment: II - A patient with                            mild systemic disease. After reviewing the risks                            and benefits, the patient was deemed in                            satisfactory condition to undergo the procedure.                           After obtaining informed consent, the colonoscope  was passed under direct vision. Throughout the                            procedure, the patient's blood pressure, pulse, and                            oxygen saturations were monitored continuously. The                            Colonoscope was introduced through the anus and                            advanced to the the cecum, identified by                             appendiceal orifice and ileocecal valve. The                            ileocecal valve, appendiceal orifice, and rectum                            were photographed. The quality of the bowel                            preparation was good. The colonoscopy was performed                            without difficulty. The patient tolerated the                            procedure well. The bowel preparation used was                            SUPREP via split dose instruction. Scope In: 11:31:08 AM Scope Out: 11:45:51 AM Scope Withdrawal Time: 0 hours 12 minutes 33 seconds  Total Procedure Duration: 0 hours 14 minutes 43 seconds  Findings:                 Two polyps were found in the transverse colon and                            ascending colon. The polyps were 2 to 3 mm in size.                            These polyps were removed with a cold snare.                            Resection and retrieval were complete.                           Internal hemorrhoids were found during                            retroflexion. The hemorrhoids were small.  The exam was otherwise without abnormality on                            direct and retroflexion views. Complications:            No immediate complications. Estimated blood loss:                            None. Estimated Blood Loss:     Estimated blood loss: none. Impression:               - Two 2 to 3 mm polyps in the transverse colon and                            in the ascending colon, removed with a cold snare.                            Resected and retrieved.                           - Internal hemorrhoids.                           - The examination was otherwise normal on direct                            and retroflexion views. Recommendation:           - Repeat colonoscopy in 5 years for surveillance if                            both polyps adenomatous.                           - Patient has a  contact number available for                            emergencies. The signs and symptoms of potential                            delayed complications were discussed with the                            patient. Return to normal activities tomorrow.                            Written discharge instructions were provided to the                            patient.                           - Resume previous diet.                           - Continue present medications.                           -  Await pathology results. Docia Chuck. Henrene Pastor, MD 03/08/2020 11:53:02 AM This report has been signed electronically.

## 2020-03-08 NOTE — Progress Notes (Signed)
Called to room to assist during endoscopic procedure.  Patient ID and intended procedure confirmed with present staff. Received instructions for my participation in the procedure from the performing physician.  

## 2020-03-08 NOTE — Progress Notes (Signed)
Report to PACU, RN, vss, BBS= Clear.  

## 2020-03-13 ENCOUNTER — Telehealth: Payer: Self-pay | Admitting: *Deleted

## 2020-03-13 ENCOUNTER — Encounter: Payer: Self-pay | Admitting: Internal Medicine

## 2020-03-13 NOTE — Telephone Encounter (Signed)
  Follow up Call-  Call back number 03/08/2020  Post procedure Call Back phone  # [phone]  Permission to leave phone message Yes  Some recent data might be hidden     Patient questions:  Do you have a fever, pain , or abdominal swelling? No. Pain Score  0 *  Have you tolerated food without any problems? Yes.    Have you been able to return to your normal activities? Yes.    Do you have any questions about your discharge instructions: Diet   No. Medications  No. Follow up visit  No.  Do you have questions or concerns about your Care? No.  Actions: * If pain score is 4 or above: No action needed, pain <4  1. Have you developed a fever since your procedure? NO  2.   Have you had an respiratory symptoms (SOB or cough) since your procedure? NO  3.   Have you tested positive for COVID 19 since your procedure NO  4.   Have you had any family members/close contacts diagnosed with the COVID 19 since your procedure?  NO   If yes to any of these questions please route to Joylene John, RN and Joella Prince, RN

## 2020-04-18 ENCOUNTER — Ambulatory Visit (INDEPENDENT_AMBULATORY_CARE_PROVIDER_SITE_OTHER): Payer: BC Managed Care – PPO | Admitting: Family Medicine

## 2020-04-18 ENCOUNTER — Encounter: Payer: Self-pay | Admitting: Family Medicine

## 2020-04-18 VITALS — BP 128/88 | HR 70 | Temp 97.4°F | Wt 223.0 lb

## 2020-04-18 DIAGNOSIS — T148XXA Other injury of unspecified body region, initial encounter: Secondary | ICD-10-CM

## 2020-04-18 DIAGNOSIS — M79621 Pain in right upper arm: Secondary | ICD-10-CM

## 2020-04-18 NOTE — Patient Instructions (Signed)
Start Naproxen 500 mg as instructed on bottle with food. Ice your right bicep for 20 minutes every 1-2 hours as much as you can for next week or so. REST the arm for a minimal of 3 weeks (no lifting).      Muscle Strain A muscle strain is an injury that happens when a muscle is stretched longer than normal. This can happen during a fall, sports, or lifting. This can tear some muscle fibers. Usually, recovery from muscle strain takes 1-2 weeks. Complete healing normally takes 5-6 weeks. This condition is first treated with PRICE therapy. This involves:  Protecting your muscle from being injured again.  Resting your injured muscle.  Icing your injured muscle.  Applying pressure (compression) to your injured muscle. This may be done with a splint or elastic bandage.  Raising (elevating) your injured muscle. Your doctor may also recommend medicine for pain. Follow these instructions at home: If you have a splint:  Wear the splint as told by your doctor. Take it off only as told by your doctor.  Loosen the splint if your fingers or toes tingle, get numb, or turn cold and blue.  Keep the splint clean.  If the splint is not waterproof: ? Do not let it get wet. ? Cover it with a watertight covering when you take a bath or a shower. Managing pain, stiffness, and swelling   If directed, put ice on your injured area. ? If you have a removable splint, take it off as told by your doctor. ? Put ice in a plastic bag. ? Place a towel between your skin and the bag. ? Leave the ice on for 20 minutes, 2-3 times a day.  Move your fingers or toes often. This helps to avoid stiffness and lessen swelling.  Raise your injured area above the level of your heart while you are sitting or lying down.  Wear an elastic bandage as told by your doctor. Make sure it is not too tight. General instructions  Take over-the-counter and prescription medicines only as told by your doctor.  Limit your  activity. Rest your injured muscle as told by your doctor. Your doctor may say that gentle movements are okay.  If physical therapy was prescribed, do exercises as told by your doctor.  Do not put pressure on any part of the splint until it is fully hardened. This may take many hours.  Do not use any products that contain nicotine or tobacco, such as cigarettes and e-cigarettes. These can delay bone healing. If you need help quitting, ask your doctor.  Warm up before you exercise. This helps to prevent more muscle strains.  Ask your doctor when it is safe to drive if you have a splint.  Keep all follow-up visits as told by your doctor. This is important. Contact a doctor if:  You have more pain or swelling in your injured area. Get help right away if:  You have any of these problems in your injured area: ? You have numbness. ? You have tingling. ? You lose a lot of strength. Summary  A muscle strain is an injury that happens when a muscle is stretched longer than normal.  This condition is first treated with PRICE therapy. This includes protecting, resting, icing, adding pressure, and raising your injury.  Limit your activity. Rest your injured muscle as told by your doctor. Your doctor may say that gentle movements are okay.  Warm up before you exercise. This helps to prevent more muscle  strains. This information is not intended to replace advice given to you by your health care provider. Make sure you discuss any questions you have with your health care provider. Document Revised: 08/19/2018 Document Reviewed: 07/30/2016 Elsevier Patient Education  [place].

## 2020-04-18 NOTE — Progress Notes (Signed)
Patient ID: Brandon Williams, male    DOB: 23-Apr-1971, 49 y.o.   MRN: [id number]   Chief Complaint  Patient presents with  . right arm pain    Patient reports right upper arm pain x 3 weeks, throbbing at times and aching others.    Subjective:  CC: Right arm pain  Reports right upper arm pain described as throbbing and aching x3 weeks.  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  Arm feels like it is going to "give out ".  Denies radiating pain, denies numbness, denies tingling.  Pain does not move past the elbow, stays only in the right bicep area, possibly shoulder.  [name].    Medical History Brandon Williams has a past medical history of Allergic rhinitis, Allergy, Family history of malignant hyperthermia, and Hyperlipidemia.   Outpatient Encounter Medications as of 04/18/2020  Medication Sig  . atorvastatin (LIPITOR) 10 MG tablet Take 1 tablet (10 mg total) by mouth at bedtime.  Marland Kitchen glucosamine-chondroitin 500-400 MG tablet Take 1 tablet by mouth daily.  Marland Kitchen KRILL OIL PO Take 500 mg by mouth daily.  Marland Kitchen levothyroxine (SYNTHROID) 75 MCG tablet TAKE 1 TABLET BY MOUTH BEFORE BREAKFAST  . Multiple Vitamin (MULTI VITAMIN MENS PO) Take 1 tablet by mouth daily.   No facility-administered encounter medications on file as of 04/18/2020.     Review of Systems  Constitutional: Negative.   HENT: Negative.   Eyes: Negative.   Respiratory: Negative.   Cardiovascular: Negative.        [name].  Gastrointestinal: Negative.   Endocrine: Negative.   Musculoskeletal: Positive for myalgias. Negative for back pain, gait problem, joint swelling, neck pain and neck stiffness.       Right bicep only.  Skin: Negative.   Neurological: Negative for weakness and numbness.       No radiation, no numbness, no tingling.  Hematological: Negative.   Psychiatric/Behavioral: Negative.      Vitals BP 128/88   Pulse 70   Temp (!)  97.4 F (36.3 C)   Wt 223 lb (101.2 kg)   SpO2 96%   BMI 32.93 kg/m   Objective:   Physical Exam Vitals and nursing note reviewed.  Constitutional:      General: [name].    Appearance: Normal appearance.  Eyes:     Extraocular Movements: Extraocular movements intact.     Pupils: Pupils are equal, round, and reactive to light.  Cardiovascular:     Rate and Rhythm: Normal rate and regular rhythm.     Heart sounds: Normal heart sounds.  Pulmonary:     Effort: Pulmonary effort is normal.     Breath sounds: Normal breath sounds.  Musculoskeletal:        General: Tenderness present. No swelling, deformity or signs of injury.     Cervical back: Normal range of motion. No rigidity or tenderness.     Comments: No right bicep bulging, no pain upon palpation. Range of motion to right shoulder and left shoulder normal.  No obvious shoulder injury.  Skin:    General: Skin is warm and dry.  Neurological:     Mental Status: [name], place, and time.     Cranial Nerves: No cranial nerve deficit.     Sensory: No sensory deficit.     Motor: No weakness.  Gait: Gait normal.     Comments: Detailed neurological exam negative.      Assessment and Plan   1. Muscle strain   No neurological red flags noted today.  Bilateral upper extremities equally strong.  [name] with all the heavy lifting that he is done for the last 3 weeks.  Plan: He will use naproxen 500 mg over-the-counter as instructed on the bottle with food. He will use ice for 20 minutes every 1-2 hours as often as he possibly can get it into the day. He will rest this right arm is much as possible, no heavy lifting for a minimal of 3 weeks.  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  [name]. [name]: muscle weakness,  radiating pain, numbness or tingling. [name], if he is completely better with conservative treatment, he can cancel this appointment.

## 2020-05-05 LAB — LIPID PANEL
Chol/HDL Ratio: 4.9 ratio (ref 0.0–5.0)
Cholesterol, Total: 183 mg/dL (ref 100–199)
HDL: 37 mg/dL — ABNORMAL LOW (ref >39)
LDL Chol Calc (NIH): 105 mg/dL — ABNORMAL HIGH (ref 0–99)
Triglycerides: 235 mg/dL — ABNORMAL HIGH (ref 0–149)
VLDL Cholesterol Cal: 41 mg/dL — ABNORMAL HIGH (ref 5–40)

## 2020-05-05 LAB — HEPATIC FUNCTION PANEL
ALT: 55 IU/L — ABNORMAL HIGH (ref 0–44)
AST: 50 IU/L — ABNORMAL HIGH (ref 0–40)
Albumin: 5.1 g/dL — ABNORMAL HIGH (ref 4.0–5.0)
Alkaline Phosphatase: 64 IU/L (ref 44–121)
Bilirubin Total: 0.4 mg/dL (ref 0.0–1.2)
Bilirubin, Direct: 0.13 mg/dL (ref 0.00–0.40)
Total Protein: 7.4 g/dL (ref 6.0–8.5)

## 2020-05-05 LAB — GLUCOSE, RANDOM: Glucose: 100 mg/dL — ABNORMAL HIGH (ref 65–99)

## 2020-05-18 ENCOUNTER — Encounter: Payer: Self-pay | Admitting: Family Medicine

## 2020-05-18 ENCOUNTER — Ambulatory Visit (INDEPENDENT_AMBULATORY_CARE_PROVIDER_SITE_OTHER): Payer: BC Managed Care – PPO | Admitting: Family Medicine

## 2020-05-18 VITALS — BP 128/86 | HR 64 | Temp 97.7°F | Ht 69.0 in | Wt 222.8 lb

## 2020-05-18 DIAGNOSIS — R0683 Snoring: Secondary | ICD-10-CM

## 2020-05-18 DIAGNOSIS — M79601 Pain in right arm: Secondary | ICD-10-CM

## 2020-05-18 DIAGNOSIS — M7541 Impingement syndrome of right shoulder: Secondary | ICD-10-CM | POA: Insufficient documentation

## 2020-05-18 NOTE — Patient Instructions (Signed)
DASH Eating Plan DASH stands for "Dietary Approaches to Stop Hypertension." The DASH eating plan is a healthy eating plan that has been shown to reduce high blood pressure (hypertension). It may also reduce your risk for type 2 diabetes, heart disease, and stroke. The DASH eating plan may also help with weight loss. What are tips for following this plan?  General guidelines  Avoid eating more than 2,300 mg (milligrams) of salt (sodium) a day. If you have hypertension, you may need to reduce your sodium intake to 1,500 mg a day.  Limit alcohol intake to no more than 1 drink a day for nonpregnant women and 2 drinks a day for men. One drink equals 12 oz of beer, 5 oz of wine, or 1 oz of hard liquor.  Work with your health care provider to maintain a healthy body weight or to lose weight. Ask what an ideal weight is for you.  Get at least 30 minutes of exercise that causes your heart to beat faster (aerobic exercise) most days of the week. Activities may include walking, swimming, or biking.  Work with your health care provider or diet and nutrition specialist (dietitian) to adjust your eating plan to your individual calorie needs. Reading food labels   Check food labels for the amount of sodium per serving. Choose foods with less than 5 percent of the Daily Value of sodium. Generally, foods with less than 300 mg of sodium per serving fit into this eating plan.  To find whole grains, look for the word "whole" as the first word in the ingredient list. Shopping  Buy products labeled as "low-sodium" or "no salt added."  Buy fresh foods. Avoid canned foods and premade or frozen meals. Cooking  Avoid adding salt when cooking. Use salt-free seasonings or herbs instead of table salt or sea salt. Check with your health care provider or pharmacist before using salt substitutes.  Do not fry foods. Cook foods using healthy methods such as baking, boiling, grilling, and broiling instead.  Cook with  heart-healthy oils, such as olive, canola, soybean, or sunflower oil. Meal planning  Eat a balanced diet that includes: ? 5 or more servings of fruits and vegetables each day. At each meal, try to fill half of your plate with fruits and vegetables. ? Up to 6-8 servings of whole grains each day. ? Less than 6 oz of lean meat, poultry, or fish each day. A 3-oz serving of meat is about the same size as a deck of cards. One egg equals 1 oz. ? 2 servings of low-fat dairy each day. ? A serving of nuts, seeds, or beans 5 times each week. ? Heart-healthy fats. Healthy fats called Omega-3 fatty acids are found in foods such as flaxseeds and coldwater fish, like sardines, salmon, and mackerel.  Limit how much you eat of the following: ? Canned or prepackaged foods. ? Food that is high in trans fat, such as fried foods. ? Food that is high in saturated fat, such as fatty meat. ? Sweets, desserts, sugary drinks, and other foods with added sugar. ? Full-fat dairy products.  Do not salt foods before eating.  Try to eat at least 2 vegetarian meals each week.  Eat more home-cooked food and less restaurant, buffet, and fast food.  When eating at a restaurant, ask that your food be prepared with less salt or no salt, if possible. What foods are recommended? The items listed may not be a complete list. Talk with your dietitian about   what dietary choices are best for you. Grains Whole-grain or whole-wheat bread. Whole-grain or whole-wheat pasta. Brown rice. Oatmeal. Quinoa. Bulgur. Whole-grain and low-sodium cereals. Pita bread. Low-fat, low-sodium crackers. Whole-wheat flour tortillas. Vegetables Fresh or frozen vegetables (raw, steamed, roasted, or grilled). Low-sodium or reduced-sodium tomato and vegetable juice. Low-sodium or reduced-sodium tomato sauce and tomato paste. Low-sodium or reduced-sodium canned vegetables. Fruits All fresh, dried, or frozen fruit. Canned fruit in natural juice (without  added sugar). Meat and other protein foods Skinless chicken or turkey. Ground chicken or turkey. Pork with fat trimmed off. Fish and seafood. Egg whites. Dried beans, peas, or lentils. Unsalted nuts, nut butters, and seeds. Unsalted canned beans. Lean cuts of beef with fat trimmed off. Low-sodium, lean deli meat. Dairy Low-fat (1%) or fat-free (skim) milk. Fat-free, low-fat, or reduced-fat cheeses. Nonfat, low-sodium ricotta or cottage cheese. Low-fat or nonfat yogurt. Low-fat, low-sodium cheese. Fats and oils Soft margarine without trans fats. Vegetable oil. Low-fat, reduced-fat, or light mayonnaise and salad dressings (reduced-sodium). Canola, safflower, olive, soybean, and sunflower oils. Avocado. Seasoning and other foods Herbs. Spices. Seasoning mixes without salt. Unsalted popcorn and pretzels. Fat-free sweets. What foods are not recommended? The items listed may not be a complete list. Talk with your dietitian about what dietary choices are best for you. Grains Baked goods made with fat, such as croissants, muffins, or some breads. Dry pasta or rice meal packs. Vegetables Creamed or fried vegetables. Vegetables in a cheese sauce. Regular canned vegetables (not low-sodium or reduced-sodium). Regular canned tomato sauce and paste (not low-sodium or reduced-sodium). Regular tomato and vegetable juice (not low-sodium or reduced-sodium). Pickles. Olives. Fruits Canned fruit in a light or heavy syrup. Fried fruit. Fruit in cream or butter sauce. Meat and other protein foods Fatty cuts of meat. Ribs. Fried meat. Bacon. Sausage. Bologna and other processed lunch meats. Salami. Fatback. Hotdogs. Bratwurst. Salted nuts and seeds. Canned beans with added salt. Canned or smoked fish. Whole eggs or egg yolks. Chicken or turkey with skin. Dairy Whole or 2% milk, cream, and half-and-half. Whole or full-fat cream cheese. Whole-fat or sweetened yogurt. Full-fat cheese. Nondairy creamers. Whipped toppings.  Processed cheese and cheese spreads. Fats and oils Butter. Stick margarine. Lard. Shortening. Ghee. Bacon fat. Tropical oils, such as coconut, palm kernel, or palm oil. Seasoning and other foods Salted popcorn and pretzels. Onion salt, garlic salt, seasoned salt, table salt, and sea salt. Worcestershire sauce. Tartar sauce. Barbecue sauce. Teriyaki sauce. Soy sauce, including reduced-sodium. Steak sauce. Canned and packaged gravies. Fish sauce. Oyster sauce. Cocktail sauce. Horseradish that you find on the shelf. Ketchup. Mustard. Meat flavorings and tenderizers. Bouillon cubes. Hot sauce and Tabasco sauce. Premade or packaged marinades. Premade or packaged taco seasonings. Relishes. Regular salad dressings. Where to find more information:  National Heart, Lung, and Blood Institute: www.nhlbi.nih.gov  American Heart Association: www.heart.org Summary  The DASH eating plan is a healthy eating plan that has been shown to reduce high blood pressure (hypertension). It may also reduce your risk for type 2 diabetes, heart disease, and stroke.  With the DASH eating plan, you should limit salt (sodium) intake to 2,300 mg a day. If you have hypertension, you may need to reduce your sodium intake to 1,500 mg a day.  When on the DASH eating plan, aim to eat more fresh fruits and vegetables, whole grains, lean proteins, low-fat dairy, and heart-healthy fats.  Work with your health care provider or diet and nutrition specialist (dietitian) to adjust your eating plan to your   individual calorie needs. This information is not intended to replace advice given to you by your health care provider. Make sure you discuss any questions you have with your health care provider. Document Revised: 06/05/2017 Document Reviewed: 06/16/2016 Elsevier Patient Education  2020 Elsevier Inc.  

## 2020-05-18 NOTE — Progress Notes (Signed)
Patient ID: Brandon Williams, male    DOB: [birth year], 49 y.o.   MRN: [id number]   Chief Complaint  Patient presents with  . Follow-up    right arm pain- not seeing any improvement   Subjective:  CC: follow-up right upper arm pain  Presents today for follow-up for right upper extremity muscle strain.  Was initially seen on October 13 for this problem.  Conservative measures have been followed without improvement.  This problem present for about 2 months.  New concerns include an left upper arm itching with no rash.  And his wife is concerned with his snoring.  He had labs drawn since the last visit with me, will review today.    Medical History [name] has a past medical history of Allergic rhinitis, Allergy, Family history of malignant hyperthermia, and Hyperlipidemia.   Outpatient Encounter Medications as of 05/18/2020  Medication Sig  . atorvastatin (LIPITOR) 10 MG tablet Take 1 tablet (10 mg total) by mouth at bedtime.  Marland Kitchen glucosamine-chondroitin 500-400 MG tablet Take 1 tablet by mouth daily.  Marland Kitchen KRILL OIL PO Take 500 mg by mouth daily.  Marland Kitchen levothyroxine (SYNTHROID) 75 MCG tablet TAKE 1 TABLET BY MOUTH BEFORE BREAKFAST  . Multiple Vitamin (MULTI VITAMIN MENS PO) Take 1 tablet by mouth daily.   No facility-administered encounter medications on file as of 05/18/2020.  CC: follow-up from 10/13 visit for muscle strain   Review of Systems  Constitutional: Negative for chills and fever.  Respiratory: Negative for shortness of breath.   Cardiovascular: Negative for chest pain.  Gastrointestinal: Negative for abdominal pain.  Musculoskeletal:       Right upper arm strain, primarily in the right bicep area.  No obvious deformity, no known injury.  No weakness, no radiating pain, no tingling.     Vitals BP 128/86   Pulse 64   Temp 97.7 F (36.5 C) (Oral)   Ht 5\' 9"  (1.753 m)   Wt 222 lb 12.8 oz (101.1 kg)   SpO2 97%   BMI 32.90 kg/m   Objective:   Physical Exam Vitals and  nursing note reviewed.  Constitutional:      Appearance: Normal appearance.  Cardiovascular:     Rate and Rhythm: Regular rhythm.     Heart sounds: Normal heart sounds.  Pulmonary:     Effort: Pulmonary effort is normal.     Breath sounds: Normal breath sounds.  Musculoskeletal:        General: Tenderness present. Normal range of motion.     Comments: Right bicep area.  No obvious deformity  Skin:    General: Skin is warm and dry.  Neurological:     Mental Status: [name], place, and time.  Psychiatric:        Mood and Affect: Mood normal.        Behavior: Behavior normal.        Thought Content: Thought content normal.        Judgment: Judgment normal.      Assessment and Plan   1. Snoring - Split night study  2. Right arm pain - Ambulatory referral to Orthopedic Surgery   Failure with conservative treatment, no improvement with right upper extremity muscle strain.  Will refer to Ortho. Concerned with an area on his left upper arm that has periodic itching, no rash.  Will treat as if there is a rash, he will use emollient and hydrocortisone cream and alternate with Benadryl cream there  is no visible rash.  Wife concerned with his snoring, will make ambulatory referral for a sleep study.   [name]. [name]: Chest pain, shortness of breath, any significant change in health status. Understands to follow-up with Ortho for the right upper extremity muscle strength, with [place] for anything else.  Encouraged Ortho to send records to this office.  Will review results of sleep study once they become available.  Pecolia Ades, FNP-C

## 2020-06-11 ENCOUNTER — Other Ambulatory Visit: Payer: Self-pay

## 2020-06-11 ENCOUNTER — Ambulatory Visit (INDEPENDENT_AMBULATORY_CARE_PROVIDER_SITE_OTHER): Payer: BC Managed Care – PPO

## 2020-06-11 ENCOUNTER — Ambulatory Visit (INDEPENDENT_AMBULATORY_CARE_PROVIDER_SITE_OTHER): Payer: BC Managed Care – PPO | Admitting: Sports Medicine

## 2020-06-11 DIAGNOSIS — M7521 Bicipital tendinitis, right shoulder: Secondary | ICD-10-CM

## 2020-06-11 DIAGNOSIS — M25521 Pain in right elbow: Secondary | ICD-10-CM

## 2020-06-11 MED ORDER — MELOXICAM 15 MG PO TABS: ORAL_TABLET | ORAL | 3 refills | Status: DC

## 2020-06-11 NOTE — Assessment & Plan Note (Signed)
3 months of pain, referable to the distal biceps with a positive Yergason and speeds test with pain referable to the radial tuberosity. Due to failure of conservative treatment we are going to get aggressive, dorsal approach biceps sheath injection, meloxicam, formal PT, x-rays, return to see me in 4 to 6 weeks.

## 2020-06-11 NOTE — Progress Notes (Signed)
    Procedures performed today:    Procedure: Real-time Ultrasound Guided injection of the right distal biceps insertion Device: Samsung HS60  Verbal informed consent obtained.  Time-out conducted.  Noted no overlying erythema, induration, or other signs of local infection.  Skin prepped in a sterile fashion.  Local anesthesia: Topical Ethyl chloride.  With sterile technique and under real time ultrasound guidance: 1 cc Kenalog 40, 1 cc lidocaine, 1 cc bupivacaine injected easily Completed without difficulty  Advised to call if fevers/chills, erythema, induration, drainage, or persistent bleeding.  Images permanently stored and available for review in PACS.  Impression: Technically successful ultrasound guided injection.  Independent interpretation of notes and tests performed by another provider:   None.  Brief History, Exam, Impression, and Recommendations:    Distal right biceps tendinitis 3 months of pain, referable to the distal biceps with a positive Yergason and speeds test with pain referable to the radial tuberosity. Due to failure of conservative treatment we are going to get aggressive, dorsal approach biceps sheath injection, meloxicam, formal PT, x-rays, return to see me in 4 to 6 weeks.    ___________________________________________ Gwen Her. Dianah Field, M.D., ABFM., CAQSM. Primary Care and [place] Instructor of [place] [facility] [employer]

## 2020-06-19 ENCOUNTER — Other Ambulatory Visit: Payer: Self-pay

## 2020-06-19 ENCOUNTER — Ambulatory Visit (INDEPENDENT_AMBULATORY_CARE_PROVIDER_SITE_OTHER): Payer: BC Managed Care – PPO | Admitting: Rehabilitative and Restorative Service Providers"

## 2020-06-19 ENCOUNTER — Encounter: Payer: Self-pay | Admitting: Rehabilitative and Restorative Service Providers"

## 2020-06-19 DIAGNOSIS — M25511 Pain in right shoulder: Secondary | ICD-10-CM

## 2020-06-19 DIAGNOSIS — R29898 Other symptoms and signs involving the musculoskeletal system: Secondary | ICD-10-CM

## 2020-06-19 NOTE — Therapy (Signed)
[place], [place], [address] Phone: [phone]   Fax:  [phone]  Physical Therapy Evaluation  Patient Details  Name: Brandon Williams MRN: [id number] Date of Birth: 28-Feb-1971 Referring Provider (PT): Aundria Mems, MD   Encounter Date: 06/19/2020   PT End of Session - 06/19/20 1014    Visit Number 1    Number of Visits 6    Date for PT Re-Evaluation 07/31/20    Authorization Type BCBS    PT Start Time 0935    PT Stop Time 1015    PT Time Calculation (min) 40 min    Activity Tolerance Patient tolerated treatment well    Behavior During Therapy [facility] for tasks assessed/performed           Past Medical History:  Diagnosis Date  . Allergic rhinitis   . Allergy   . Family history of malignant hyperthermia    paternal grandmother and paternal uncle died in [place] from [facility]  . Hyperlipidemia     Past Surgical History:  Procedure Laterality Date  . BACK SURGERY    . thumb surgery      There were no vitals filed for this visit.    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  He had injection on 06/11/20 with Dr. Duane Lope.  Since the injection, he has significantly improved pain.  The patient had night pain and pain with all motion before the injection.    Patient Stated Goals Be able to unload supplies for work    Currently in Pain? No/denies              [facility] PT Assessment - 06/19/20 0945      Assessment   Medical Diagnosis R biceps tendonitis    Referring Provider (PT) Aundria Mems, MD    Onset Date/Surgical Date 06/11/20   onset 03/2020   Hand Dominance Right    Prior Therapy none      Precautions   Precautions None      Restrictions   Weight Bearing Restrictions No      Balance Screen   Has the patient fallen in the past 6 months No    Has the patient had a  decrease in activity level because of a fear of falling?  No    Is the patient reluctant to leave their home because of a fear of falling?  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      Observation/Other Assessments   Focus on Therapeutic Outcomes (FOTO)  31% limitation      Sensation   Light Touch Appears Intact      ROM / Strength   AROM / PROM / Strength AROM;Strength      AROM   Overall AROM  Within functional limits for tasks performed    Overall AROM Comments Mild pain superior R shoulder at end range flexion      Strength   Overall Strength Within functional limits for tasks performed      Palpation   Palpation comment tender to palpation R bicipital groove into biceps tendon  Special Tests   Other special tests *held special tests due to pain significantly improved; patient cautious to avoid reflaring pain in daily activities                      Objective measurements completed on examination: See above findings.       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    Time 6    Period Weeks    Target Date 07/31/20      PT LONG TERM GOAL #2   Title The patient will reduce limitaiton per FOTO from 31 to 24% limitation.    Time 6    Period Weeks    Target Date 07/31/20      PT LONG TERM GOAL #3   Title The patient will be able to lift 10 lbs off overhead shelf without pain.    Time 6    Period Weeks    Target Date 07/31/20      PT LONG TERM GOAL #4   Title The patient will be able to reach posterior to the frontal plane without pain.    Time 6    Period Weeks    Target Date 07/31/20                  Plan - 06/19/20 2100    Clinical Impression Statement The patient is a 49 yo male presenting to OP physical therapy with R shoulder pain recently improved after shoulder injection.  The patient has limited activity b/c of pain and PT will work to help return strength for overhead lifting in order to return to full work and recreational activities. Patient has mild pain end range flexion and sharp pain with horizontal abduction posterior to frontal plane.    Examination-Activity Limitations Lift    Examination-Participation Restrictions Occupation    Stability/Clinical Decision Making Stable/Uncomplicated    Clinical Decision Making Low    Rehab Potential Good    PT Frequency 1x / week    PT Duration 6 weeks    PT Treatment/Interventions ADLs/Self Care Home Management;Therapeutic activities;Therapeutic exercise;Manual techniques;Patient/family education;Taping;Dry needling;Passive range of motion;Iontophoresis 4mg /ml Dexamethasone;Cryotherapy;Moist Heat    PT Next Visit Plan progress strengthening to  tolerance, work on STM/DN and flexibility anterior shoulder    PT Home  Exercise Plan Access Code: [email or web address]    Consulted and Agree with Plan of Care Patient           Patient will benefit from skilled therapeutic intervention in order to improve the following deficits and impairments:  Pain,Impaired flexibility,Postural dysfunction,Decreased strength,Decreased range of motion  Visit Diagnosis: Acute pain of right shoulder  Other symptoms and signs involving the musculoskeletal system     Problem List Patient Active Problem List   Diagnosis Date Noted  . Distal right biceps tendinitis 06/11/2020  . Right arm pain 05/18/2020  . Snoring 05/18/2020  . Muscle strain 04/18/2020  . Hyperlipidemia LDL goal <100 09/11/2018  . Hypothyroidism 09/11/2018  . Elevated liver enzymes 12/09/2012  . Seasonal allergies 11/21/2012    Kary Sugrue, PT 06/19/2020, 9:11 PM  [facility] [place], [place], [address] Phone: [phone]   Fax:  [phone]  Name: Brandon Williams MRN: [id number] Date of Birth: 1970/12/24

## 2020-06-19 NOTE — Patient Instructions (Signed)
Access Code: [email or web address] URL: [email or web address] Date: 06/19/2020 Prepared by: Rudell Cobb  Exercises Supine Thoracic Mobilization Towel Roll Vertical with Arm Stretch - 2 x daily - 7 x weekly - 1 sets - 1 reps - 2 minutes hold Doorway Pec Stretch at 60 Elevation - 2 x daily - 7 x weekly - 1 sets - 2 reps - 20-30 seconds hold Doorway Pec Stretch at 120 Degrees Abduction - 2 x daily - 7 x weekly - 1 sets - 2 reps - 20-30 seconds hold Shoulder External Rotation with Anchored Resistance - 2 x daily - 7 x weekly - 1 sets - 10 reps Standing Shoulder Internal Rotation with Anchored Resistance - 2 x daily - 7 x weekly - 1 sets - 10 reps Single Arm Shoulder Extension with Anchored Resistance - 2 x daily - 7 x weekly - 1 sets - 10 reps Scaption with Resistance - 2 x daily - 7 x weekly - 1 sets - 10 reps

## 2020-06-20 ENCOUNTER — Encounter: Payer: BC Managed Care – PPO | Admitting: Physical Therapy

## 2020-07-04 ENCOUNTER — Ambulatory Visit (INDEPENDENT_AMBULATORY_CARE_PROVIDER_SITE_OTHER): Payer: BC Managed Care – PPO | Admitting: Rehabilitative and Restorative Service Providers"

## 2020-07-04 ENCOUNTER — Other Ambulatory Visit: Payer: Self-pay

## 2020-07-04 DIAGNOSIS — M25511 Pain in right shoulder: Secondary | ICD-10-CM

## 2020-07-04 DIAGNOSIS — R29898 Other symptoms and signs involving the musculoskeletal system: Secondary | ICD-10-CM

## 2020-07-04 NOTE — Therapy (Signed)
[facility] [address] [place] [address] [place], [place], [address] Phone: [phone]   Fax:  [phone]  Physical Therapy Treatment  Patient Details  Name: PELLEGRINO KENNARD MRN: [id number] Date of Birth: 1971-02-15 Referring Provider (PT): Rodney Langton, MD   Encounter Date: 07/04/2020   PT End of Session - 07/04/20 1245    Visit Number 2    Number of Visits 6    Date for PT Re-Evaluation 07/31/20    Authorization Type BCBS    PT Start Time 0803    PT Stop Time 0845    PT Time Calculation (min) 42 min    Activity Tolerance Patient tolerated treatment well    Behavior During Therapy [facility] for tasks assessed/performed           Past Medical History:  Diagnosis Date  . Allergic rhinitis   . Allergy   . Family history of malignant hyperthermia    paternal grandmother and paternal uncle died in OR from [facility]  . Hyperlipidemia     Past Surgical History:  Procedure Laterality Date  . BACK SURGERY    . thumb surgery      There were no vitals filed for this visit.   Subjective Assessment - 07/04/20 0806    Subjective The patient was feeling significant improvement until a couple of days ago when a neighbor's dog got loose and he had to separate his dog away (moved into horizontal abduction and extension).  This has increased pain in the R anterior shoulder.    Patient Stated Goals Be able to unload supplies for work    Currently in Pain? No/denies    Pain Score --   None at rest; up to a 4-5/10 with certain movements.             [facility] PT Assessment - 07/04/20 0809      Assessment   Medical Diagnosis R biceps tendonitis    Referring Provider (PT) Rodney Langton, MD    Onset Date/Surgical Date 06/11/20    Hand Dominance Right    Prior Therapy none                         [facility] Adult PT Treatment/Exercise - 07/04/20 0809      Exercises   Exercises Shoulder      Shoulder Exercises:  Standing   External Rotation Strengthening;Right;10 reps    External Rotation Weight (lbs) 3 lbs    Row Strengthening;Both;10 reps    Theraband Level (Shoulder Row) Level 3 (Green)    Other Standing Exercises standing shoulder circles      Shoulder Exercises: ROM/Strengthening   UBE (Upper Arm Bike) L2 x 2.5 minutes, 1 minute back      Shoulder Exercises: Stretch   Corner Stretch Limitations unable to tolerate door frame stretch today prior to dry needling, could tolerate after dry needling    Other Shoulder Stretches supine towel roll stretch is tolerable.    Other Shoulder Stretches supine towel roll "V" for pec stretch *irritates thoracic spine      Manual Therapy   Manual Therapy Joint mobilization;Soft tissue mobilization;Myofascial release    Manual therapy comments skilled palpation to assess soft tissue response to dry needling, to decrease tightness and reduce pain    Joint Mobilization A>P grade Ii-III AC joint mobs    Soft tissue mobilization deltoid, biceps, pec    Myofascial Release pec  Trigger Point Dry Needling - 07/04/20 1259    Consent Given? Yes    Education Handout Provided Yes    Muscles Treated Upper Quadrant Pectoralis major;Deltoid;Biceps    Dry Needling Comments right UE    Pectoralis Major Response Palpable increased muscle length    Deltoid Response Palpable increased muscle length    Biceps Response Palpable increased muscle length                PT Education - 07/04/20 1245    Education Details HEP progression    Person(s) Educated Patient    Methods Explanation;Demonstration;Handout    Comprehension Verbalized understanding;Returned demonstration               PT Long Term Goals - 06/19/20 2050      PT LONG TERM GOAL #1   Title The patient will be indep with HEP for shoulder strengthening, postural flexibility.    Time 6    Period Weeks    Target Date 07/31/20      PT LONG TERM GOAL #2   Title The patient will  reduce limitaiton per FOTO from 31 to 24% limitation.    Time 6    Period Weeks    Target Date 07/31/20      PT LONG TERM GOAL #3   Title The patient will be able to lift 10 lbs off overhead shelf without pain.    Time 6    Period Weeks    Target Date 07/31/20      PT LONG TERM GOAL #4   Title The patient will be able to reach posterior to the frontal plane without pain.    Time 6    Period Weeks    Target Date 07/31/20                 Plan - 07/04/20 1300    Clinical Impression Statement The patient had reduced superior shoulder pain after DN to pecs and deltoids.  He could tolerate improved end range stretching and had no pain with motion.  PT continues to progress ther ex for strengthening and flexibility. Continue working to [employer].    PT Treatment/Interventions ADLs/Self Care Home Management;Therapeutic activities;Therapeutic exercise;Manual techniques;Patient/family education;Taping;Dry needling;Passive range of motion;Iontophoresis 4mg /ml Dexamethasone;Cryotherapy;Moist Heat    PT Next Visit Plan progress strengthening to tolerance, work on STM/DN and flexibility anterior shoulder    PT Home Exercise Plan Access Code: [email or web address]    Consulted and Agree with Plan of Care Patient           Patient will benefit from skilled therapeutic intervention in order to improve the following deficits and impairments:  Pain,Impaired flexibility,Postural dysfunction,Decreased strength,Decreased range of motion  Visit Diagnosis: Acute pain of right shoulder  Other symptoms and signs involving the musculoskeletal system     Problem List Patient Active Problem List   Diagnosis Date Noted  . Distal right biceps tendinitis 06/11/2020  . Right arm pain 05/18/2020  . Snoring 05/18/2020  . Muscle strain 04/18/2020  . Hyperlipidemia LDL goal <100 09/11/2018  . Hypothyroidism 09/11/2018  . Elevated liver enzymes 12/09/2012  . Seasonal allergies 11/21/2012    Cybill Uriegas,  PT 07/04/2020, 1:03 PM  [facility] [place], [place], [address] Phone: [phone]   Fax:  [phone]  Name: CORIN CUZZORT MRN: [id number] Date of Birth: [birth year]

## 2020-07-04 NOTE — Patient Instructions (Signed)
Access Code: [email or web address] URL: [email or web address] Date: 07/04/2020 Prepared by: Margretta Ditty  Exercises Supine Thoracic Mobilization Towel Roll Vertical with Arm Stretch - 2 x daily - 7 x weekly - 1 sets - 1 reps - 2 minutes hold Supine Thoracic Mobilization Foam Roll Horizontal with Arm Stretch - 2 x daily - 7 x weekly - 1 sets - 1 reps - 1-2 minutes hold Doorway Pec Stretch at 60 Elevation - 2 x daily - 7 x weekly - 1 sets - 3 reps Standing with Forearms Thoracic Rotation - 2 x daily - 7 x weekly - 1 sets - 10 reps Single Arm Scaption with Dumbbell - 2 x daily - 7 x weekly - 1 sets - 10-15 reps Standing External Rotation at 90 with Dumbbell - 2 x daily - 7 x weekly - 1 sets - 10 reps Standing Row with Resistance with Anchored Resistance at Chest Height Palms Down - 2 x daily - 7 x weekly - 1 sets - 10 reps  Patient Education Trigger Point Dry Needling

## 2020-07-16 ENCOUNTER — Ambulatory Visit (INDEPENDENT_AMBULATORY_CARE_PROVIDER_SITE_OTHER): Payer: BC Managed Care – PPO | Admitting: Rehabilitative and Restorative Service Providers"

## 2020-07-16 ENCOUNTER — Other Ambulatory Visit: Payer: Self-pay

## 2020-07-16 ENCOUNTER — Encounter: Payer: Self-pay | Admitting: Rehabilitative and Restorative Service Providers"

## 2020-07-16 DIAGNOSIS — R29898 Other symptoms and signs involving the musculoskeletal system: Secondary | ICD-10-CM | POA: Diagnosis not present

## 2020-07-16 DIAGNOSIS — M25511 Pain in right shoulder: Secondary | ICD-10-CM

## 2020-07-16 NOTE — Patient Instructions (Signed)
Access Code: [email or web address] URL: [email or web address] Date: 07/16/2020 Prepared by: Rudell Cobb  Exercises Supine Thoracic Mobilization Towel Roll Vertical with Arm Stretch - 2 x daily - 7 x weekly - 1 sets - 1 reps - 2 minutes hold Supine Thoracic Mobilization Foam Roll Horizontal with Arm Stretch - 2 x daily - 7 x weekly - 1 sets - 1 reps - 1-2 minutes hold Doorway Pec Stretch at 60 Elevation - 2 x daily - 7 x weekly - 1 sets - 3 reps Standing with Forearms Thoracic Rotation - 2 x daily - 7 x weekly - 1 sets - 10 reps Single Arm Scaption with Dumbbell - 2 x daily - 7 x weekly - 1 sets - 10-15 reps Standing Row with Resistance with Anchored Resistance at Chest Height Palms Down - 2 x daily - 7 x weekly - 1 sets - 10 reps Standing Pec Stretch at Wall - 2 x daily - 7 x weekly - 1 sets - 2-3 reps - 30 seconds hold Seated Shoulder External Rotation in Abduction Supported with Dumbbell - 2 x daily - 7 x weekly - 1 sets - 10 reps CLX Throwing Acceleration - 2 x daily - 7 x weekly - 1 sets - 10 reps

## 2020-07-16 NOTE — Therapy (Signed)
[place], [place], [address] Phone: [phone]   Fax:  [phone]  Physical Therapy Treatment  Patient Details  Name: Brandon Williams MRN: [id number] Date of Birth: 04-Oct-1970 Referring Provider (PT): Aundria Mems, MD   Encounter Date: 07/16/2020   PT End of Session - 07/16/20 0804    Visit Number 3    Number of Visits 6    Date for PT Re-Evaluation 07/31/20    Authorization Type BCBS    PT Start Time 0804    PT Stop Time 0844    PT Time Calculation (min) 40 min    Activity Tolerance Patient tolerated treatment well    Behavior During Therapy [facility] for tasks assessed/performed           Past Medical History:  Diagnosis Date  . Allergic rhinitis   . Allergy   . Family history of malignant hyperthermia    paternal grandmother and paternal uncle died in OR from [facility]  . Hyperlipidemia     Past Surgical History:  Procedure Laterality Date  . BACK SURGERY    . thumb surgery      There were no vitals filed for this visit.   Subjective Assessment - 07/16/20 0805    Subjective The patient reports pain has moved from distal UE (biceps near elbow) to anterior and superior shoulder.  Pain is present any time he uses it.    Patient Stated Goals Be able to unload supplies for work    Currently in Pain? Yes    Pain Score --   None at rest, pain up to 8-9/10 at times.   Pain Location Shoulder    Pain Orientation Right    Pain Descriptors / Indicators Aching;Sore;Sharp    Pain Type Chronic pain    Pain Onset More than a month ago    Pain Frequency Intermittent    Aggravating Factors  throwing a ball, reaching, overhead use    Pain Relieving Factors rest              [facility] PT Assessment - 07/16/20 0808      Assessment   Medical Diagnosis R biceps tendonitis    Referring Provider (PT) Aundria Mems, MD    Onset Date/Surgical Date 06/11/20    Hand Dominance Right                          [facility] Adult PT Treatment/Exercise - 07/16/20 0808      Exercises   Exercises Shoulder      Shoulder Exercises: Supine   Protraction Strengthening;Right;10 reps    Protraction Weight (lbs) 3    Protraction Limitations no pain    Horizontal ABduction AROM;Both;10 reps    External Rotation PROM;AROM    External Rotation Limitations adding rhythmic stabilization at end range    Flexion AROM;Both;5 reps    Flexion Limitations working on end range    Other Supine Exercises neural glide with horizontal abduction + wrist extension bilaterally x 10 reps      Shoulder Exercises: Seated   External Rotation Strengthening;Right;10 reps    External Rotation Weight (lbs) 3    External Rotation Limitations modified from standing due to increased clicking sensations      Shoulder Exercises: Sidelying   ABduction AAROM;Right;10 reps    ABduction Limitations with parascapular stretching      Shoulder Exercises: Standing   Diagonals Strengthening;Right    Diagonals Limitations  moving ER to throwing position/ then IR to ER with cross body motion; then performed D2 flexion with red band      Shoulder Exercises: ROM/Strengthening   UBE (Upper Arm Bike) L3 x 2 minutes forward, 1 minute backwards      Shoulder Exercises: Isometric Strengthening   External Rotation --   standing x 5 seconds x 8 reps near wall     Shoulder Exercises: Stretch   Corner Stretch Limitations performed door frame stretch at 60, and 90 x 30 second holds    Wall Stretch - ABduction 2 reps    Wall Stretch - ABduction Limitations gentle stretching for anterior capsule      Manual Therapy   Manual Therapy Passive ROM;Joint mobilization    Joint Mobilization A>P grade Ii-III AC joint mobs    Myofascial Release pec                  PT Education - 07/16/20 1249    Education Details HEP progression    Person(s) Educated Patient    Methods Explanation;Demonstration;Handout     Comprehension Verbalized understanding;Returned demonstration               PT Long Term Goals - 06/19/20 2050      PT LONG TERM GOAL #1   Title The patient will be indep with HEP for shoulder strengthening, postural flexibility.    Time 6    Period Weeks    Target Date 07/31/20      PT LONG TERM GOAL #2   Title The patient will reduce limitaiton per FOTO from 31 to 24% limitation.    Time 6    Period Weeks    Target Date 07/31/20      PT LONG TERM GOAL #3   Title The patient will be able to lift 10 lbs off overhead shelf without pain.    Time 6    Period Weeks    Target Date 07/31/20      PT LONG TERM GOAL #4   Title The patient will be able to reach posterior to the frontal plane without pain.    Time 6    Period Weeks    Target Date 07/31/20                 Plan - 07/16/20 1250    Clinical Impression Statement The patient's pain has moved from distal/biceps region to anterior/superior shoulder and is worse with end range flexion and ER.  We also discussed home tasks like throwing the tennis ball with his dog, or starting generator that also lead to pain.  PT to progress to tolerance.    PT Treatment/Interventions ADLs/Self Care Home Management;Therapeutic activities;Therapeutic exercise;Manual techniques;Patient/family education;Taping;Dry needling;Passive range of motion;Iontophoresis 4mg /ml Dexamethasone;Cryotherapy;Moist Heat    PT Next Visit Plan progress strengthening to tolerance, work on STM/DN and flexibility anterior shoulder    PT Home Exercise Plan Access Code: [email or web address]    Consulted and Agree with Plan of Care Patient           Patient will benefit from skilled therapeutic intervention in order to improve the following deficits and impairments:  Pain,Impaired flexibility,Postural dysfunction,Decreased strength,Decreased range of motion  Visit Diagnosis: Other symptoms and signs involving the musculoskeletal system  Acute pain of right  shoulder     Problem List Patient Active Problem List   Diagnosis Date Noted  . Distal right biceps tendinitis 06/11/2020  . Right arm pain 05/18/2020  . Snoring 05/18/2020  .  Muscle strain 04/18/2020  . Hyperlipidemia LDL goal <100 09/11/2018  . Hypothyroidism 09/11/2018  . Elevated liver enzymes 12/09/2012  . Seasonal allergies 11/21/2012    Brittany Amirault, PT 07/16/2020, 12:54 PM  [facility] [place], [place], [address] Phone: [phone]   Fax:  [phone]  Name: ALAKI ROSEL MRN: [id number] Date of Birth: October 22, 1970

## 2020-07-30 ENCOUNTER — Ambulatory Visit (INDEPENDENT_AMBULATORY_CARE_PROVIDER_SITE_OTHER): Payer: BC Managed Care – PPO | Admitting: Rehabilitative and Restorative Service Providers"

## 2020-07-30 ENCOUNTER — Encounter: Payer: Self-pay | Admitting: Rehabilitative and Restorative Service Providers"

## 2020-07-30 ENCOUNTER — Other Ambulatory Visit: Payer: Self-pay

## 2020-07-30 DIAGNOSIS — R29898 Other symptoms and signs involving the musculoskeletal system: Secondary | ICD-10-CM

## 2020-07-30 DIAGNOSIS — M25511 Pain in right shoulder: Secondary | ICD-10-CM | POA: Diagnosis not present

## 2020-07-30 NOTE — Therapy (Signed)
[place], [place], [address] Phone: [phone]   Fax:  [phone]  Physical Therapy Treatment and Renewal Summary  Patient Details  Name: PASCHAL BLANTON MRN: [id number] Date of Birth: [birth year] Referring Provider (PT): Aundria Mems, MD   Encounter Date: 07/30/2020   PT End of Session - 07/30/20 0806    Visit Number 4    Number of Visits 8    Date for PT Re-Evaluation 08/29/20    Authorization Type BCBS    PT Start Time 0802    PT Stop Time 0842    PT Time Calculation (min) 40 min    Activity Tolerance Patient tolerated treatment well    Behavior During Therapy [facility] for tasks assessed/performed           Past Medical History:  Diagnosis Date  . Allergic rhinitis   . Allergy   . Family history of malignant hyperthermia    paternal grandmother and paternal uncle died in OR from [facility]  . Hyperlipidemia     Past Surgical History:  Procedure Laterality Date  . BACK SURGERY    . thumb surgery      There were no vitals filed for this visit.   Subjective Assessment - 07/30/20 0803    Subjective The patient reports he had to set work station up by himself last week and had mild pain.  He reports he is doing HEP.  He has not felt pain over the past couple of days.  He can tolerate lifting in small amounts, but repetitive movement does irritate the anterior aspect of the shoulder.  He is no longer getting distal pain (all proximal).  Patient reports "it's 100 times better."    Patient Stated Goals Be able to unload supplies for work    Currently in Pain? No/denies              [facility] PT Assessment - 07/30/20 5038      Assessment   Medical Diagnosis R biceps tendonitis    Referring Provider (PT) Aundria Mems, MD    Onset Date/Surgical Date 06/11/20    Hand Dominance Right                         [facility] Adult PT Treatment/Exercise - 07/30/20 0808       Self-Care   Self-Care Other Self-Care Comments    Other Self-Care Comments  Discussed returning to using R UE normally in day to day function.      Exercises   Exercises Shoulder      Shoulder Exercises: Standing   Horizontal ABduction AROM    Horizontal ABduction Limitations no pain    Row Strengthening;Both;12 reps    Theraband Level (Shoulder Row) Level 4 (Blue)    Diagonals Strengthening;Right;10 reps    Theraband Level (Shoulder Diagonals) Level 4 (Blue)    Diagonals Limitations moving ER to throwing position/ then IR to ER with cross body motion; then performed D2 flexion with red band (12 reps each)    Other Standing Exercises overhead press with 5 lbs each UE x 12 reps    Other Standing Exercises overhead press with abduction x 5 lbs x 12 reps      Shoulder Exercises: ROM/Strengthening   UBE (Upper Arm Bike) L4 x 2 minutes forwrd/ 1 minute backwards      Shoulder Exercises: Stretch   Corner Stretch Limitations performed door frame stretch at 60, and 90  x 30 second holds    Wall Stretch - ABduction 2 reps    Wall Stretch - ABduction Limitations gentle stretching for anterior capsule    Other Shoulder Stretches supine towel roll stretch is tolerable.    Other Shoulder Stretches supine towel roll "V" for pec stretch      Shoulder Exercises: Body Blade   Flexion 60 seconds    ABduction 60 seconds                  PT Education - 07/30/20 1315    Education Details HEP progression    Person(s) Educated Patient    Methods Explanation;Demonstration;Handout    Comprehension Verbalized understanding;Returned demonstration               PT Long Term Goals - 07/30/20 0814      PT LONG TERM GOAL #1   Title The patient will be indep with HEP for shoulder strengthening, postural flexibility.    Time 6    Period Weeks    Status Achieved      PT LONG TERM GOAL #2   Title The patient will reduce limitaiton per FOTO from 31 to 24% limitation.    Baseline 26%  limitation    Time 6    Period Weeks    Status Partially Met      PT LONG TERM GOAL #3   Title The patient will be able to lift 10 lbs off overhead shelf without pain.    Time 6    Period Weeks    Status Achieved      PT LONG TERM GOAL #4   Title The patient will be able to reach posterior to the frontal plane without pain.    Time 6    Period Weeks    Status Achieved            UPDATED LONG TERM GOALS:  PT Long Term Goals - 07/30/20 1322      PT LONG TERM GOAL #1   Title The patient will be independent with progression of HEP.    Time 4    Period Weeks    Status Revised    Target Date 08/29/20      PT LONG TERM GOAL #2   Title The patient will reduce limitaiton per FOTO from 31 to 24% limitation.    Baseline 26% limitation    Time 4    Period Weeks    Status Revised    Target Date 08/29/20              Plan - 07/30/20 7169    Clinical Impression Statement The patient has partially met LTGs.  He is no longer experiencing distal biceps pain.  He does continue to get tightness in R anterior shoulder and some discomfort with repetitive lifting of boxes.  PT to f/u in 2 weeks to check on progress with HEP and patient use in daily activities.    PT Frequency 1x / week    PT Duration 4 weeks    PT Treatment/Interventions ADLs/Self Care Home Management;Therapeutic activities;Therapeutic exercise;Manual techniques;Patient/family education;Taping;Dry needling;Passive range of motion;Iontophoresis 65m/ml Dexamethasone;Cryotherapy;Moist Heat    PT Next Visit Plan progress strengthening to tolerance, work on STM/DN and flexibility anterior shoulder    PT Home Exercise Plan Access Code: [email or web address]   Consulted and Agree with Plan of Care Patient           Patient will benefit from skilled therapeutic intervention in order to improve  the following deficits and impairments:     Visit Diagnosis: Other symptoms and signs involving the musculoskeletal system  Acute pain of  right shoulder     Problem List Patient Active Problem List   Diagnosis Date Noted  . Distal right biceps tendinitis 06/11/2020  . Right arm pain 05/18/2020  . Snoring 05/18/2020  . Muscle strain 04/18/2020  . Hyperlipidemia LDL goal <100 09/11/2018  . Hypothyroidism 09/11/2018  . Elevated liver enzymes 12/09/2012  . Seasonal allergies 11/21/2012    ,, PT 07/30/2020, 1:20 PM  [facility] [place], [place], [address] Phone: [phone]   Fax:  [phone]  Name: TYRESS LODEN MRN: [id number] Date of Birth: 1971-01-21

## 2020-07-30 NOTE — Patient Instructions (Signed)
Access Code: [email or web address] URL: [email or web address] Date: 07/30/2020 Prepared by: Rudell Cobb  Exercises Supine Thoracic Mobilization Towel Roll Vertical with Arm Stretch - 2 x daily - 7 x weekly - 1 sets - 1 reps - 2 minutes hold Supine Thoracic Mobilization Foam Roll Horizontal with Arm Stretch - 2 x daily - 7 x weekly - 1 sets - 1 reps - 1-2 minutes hold Doorway Pec Stretch at 60 Elevation - 2 x daily - 7 x weekly - 1 sets - 3 reps Standing with Forearms Thoracic Rotation - 2 x daily - 7 x weekly - 1 sets - 10 reps Single Arm Scaption with Dumbbell - 2 x daily - 7 x weekly - 1 sets - 10-15 reps Standing Row with Resistance with Anchored Resistance at Chest Height Palms Down - 2 x daily - 7 x weekly - 1 sets - 10 reps Standing Pec Stretch at Wall - 2 x daily - 7 x weekly - 1 sets - 2-3 reps - 30 seconds hold Seated Shoulder External Rotation in Abduction Supported with Dumbbell - 2 x daily - 7 x weekly - 1 sets - 10 reps CLX Throwing Acceleration - 2 x daily - 7 x weekly - 1 sets - 10 reps Shoulder Overhead Press in Flexion with Dumbbells - 2 x daily - 7 x weekly - 1 sets - 10 reps

## 2020-08-10 ENCOUNTER — Other Ambulatory Visit: Payer: Self-pay | Admitting: *Deleted

## 2020-08-10 NOTE — Telephone Encounter (Signed)
Sent  My chart message to schedule appointment 

## 2020-08-13 ENCOUNTER — Other Ambulatory Visit: Payer: Self-pay

## 2020-08-13 ENCOUNTER — Ambulatory Visit (INDEPENDENT_AMBULATORY_CARE_PROVIDER_SITE_OTHER): Payer: BC Managed Care – PPO | Admitting: Rehabilitative and Restorative Service Providers"

## 2020-08-13 DIAGNOSIS — R29898 Other symptoms and signs involving the musculoskeletal system: Secondary | ICD-10-CM

## 2020-08-13 DIAGNOSIS — M25511 Pain in right shoulder: Secondary | ICD-10-CM

## 2020-08-13 NOTE — Therapy (Signed)
[place], [place], [address] Phone: [phone]   Fax:  [phone]  Physical Therapy Treatment  Patient Details  Name: Brandon Williams MRN: [id number] Date of Birth: 03/17/1971 Referring Provider (PT): Aundria Mems, MD   Encounter Date: 08/13/2020   PT End of Session - 08/13/20 0808    Visit Number 5    Number of Visits 8    Date for PT Re-Evaluation 08/29/20    Authorization Type BCBS    PT Start Time 0802    PT Stop Time 0843    PT Time Calculation (min) 41 min    Activity Tolerance Patient tolerated treatment well    Behavior During Therapy [facility] for tasks assessed/performed           Past Medical History:  Diagnosis Date  . Allergic rhinitis   . Allergy   . Family history of malignant hyperthermia    paternal grandmother and paternal uncle died in OR from [facility]  . Hyperlipidemia     Past Surgical History:  Procedure Laterality Date  . BACK SURGERY    . thumb surgery      There were no vitals filed for this visit.   Subjective Assessment - 08/13/20 0805    Subjective The patient reports that as he progressed strengthening, pain worsened.  He is not able to throw a ball overhand with his dog.    Patient Stated Goals Be able to unload supplies for work    Currently in Pain? No/denies    Pain Score --   gets a stinging pain in his shoulder 3-4/10 with use; biceps and elbow also hurt with use.   Pain Location Shoulder    Pain Orientation Right    Pain Descriptors / Indicators Aching;Sore    Pain Type Chronic pain    Pain Radiating Towards biceps and elbow    Pain Onset More than a month ago    Pain Frequency Intermittent    Aggravating Factors  throwing a ball, overhead use, strengthening exercises    Pain Relieving Factors rest              [facility] PT Assessment - 08/13/20 0809      Assessment   Medical Diagnosis R biceps tendonitis    Referring Provider (PT)  Aundria Mems, MD    Onset Date/Surgical Date 06/11/20    Hand Dominance Right                         [facility] Adult PT Treatment/Exercise - 08/13/20 0809      Exercises   Exercises Shoulder      Shoulder Exercises: Standing   External Rotation Strengthening;Right;12 reps    External Rotation Weight (lbs) 3 lbs    Flexion Strengthening;Both;12 reps    Shoulder Flexion Weight (lbs) 3 lbs    ABduction Strengthening;Both;12 reps    Shoulder ABduction Weight (lbs) 3 lbs    ABduction Limitations scaption    Other Standing Exercises wall lean with thoracic rotation      Shoulder Exercises: ROM/Strengthening   UBE (Upper Arm Bike) L3 x 2 minutes forward/ 1 minute backwards      Shoulder Exercises: Stretch   Corner Stretch Limitations performed door frame stretch at 60 degrees    Wall Stretch - ABduction 2 reps;30 seconds    Wall Stretch - ABduction Limitations gentle stretching for anterior capsule      Manual Therapy  Manual Therapy Soft tissue mobilization    Manual therapy comments skilled palpation to assess soft tissue response to dry needling, to decrease tightness and reduce pain    Soft tissue mobilization R bicipital groove, pectoralis, wrist flexors            Trigger Point Dry Needling - 08/13/20 1039    Consent Given? Yes    Education Handout Provided Previously provided    Muscles Treated Upper Quadrant Biceps    Muscles Treated Wrist/Hand Flexor carpi ulnaris    Dry Needling Comments right UE    Biceps Response Palpable increased muscle length    Flexor carpi ulnaris Response Palpable increased muscle length                PT Education - 08/13/20 1008    Education Details HEP modification    Person(s) Educated Patient    Methods Explanation;Demonstration;Handout    Comprehension Verbalized understanding;Returned demonstration               PT Long Term Goals - 07/30/20 1322      PT LONG TERM GOAL #1   Title The patient  will be independent with progression of HEP.    Time 4    Period Weeks    Status Revised    Target Date 08/29/20      PT LONG TERM GOAL #2   Title The patient will reduce limitaiton per FOTO from 31 to 24% limitation.    Baseline 26% limitation    Time 4    Period Weeks    Status Revised    Target Date 08/29/20                 Plan - 08/13/20 1008    Clinical Impression Statement The patient was progressing well with activities.  Last session on 1/24, PT progressed strengthening adding diagonal planes and he has had increased pain in the R bicipital groove, R pec, and R elbow (at wrist flexor origin).  PT modified HEP to remove irritating activities, worked on stretching, nerve gliding and continued with straight plane strengthening activities.  Pt to f/u with MD regarding continued pain with strengthening.    PT Treatment/Interventions ADLs/Self Care Home Management;Therapeutic activities;Therapeutic exercise;Manual techniques;Patient/family education;Taping;Dry needling;Passive range of motion;Iontophoresis 4mg /ml Dexamethasone;Cryotherapy;Moist Heat    PT Next Visit Plan progress strengthening to tolerance, work on STM/DN and flexibility anterior shoulder    PT Home Exercise Plan Access Code: [email or web address]    Consulted and Agree with Plan of Care Patient           Patient will benefit from skilled therapeutic intervention in order to improve the following deficits and impairments:  Pain,Impaired flexibility,Postural dysfunction,Decreased strength,Decreased range of motion  Visit Diagnosis: Other symptoms and signs involving the musculoskeletal system  Acute pain of right shoulder     Problem List Patient Active Problem List   Diagnosis Date Noted  . Distal right biceps tendinitis 06/11/2020  . Right arm pain 05/18/2020  . Snoring 05/18/2020  . Muscle strain 04/18/2020  . Hyperlipidemia LDL goal <100 09/11/2018  . Hypothyroidism 09/11/2018  . Elevated liver enzymes  12/09/2012  . Seasonal allergies 11/21/2012    Lesslie Williams , PT 08/13/2020, 10:42 AM  [facility] [place], [place], [address] Phone: [phone]   Fax:  [phone]  Name: Brandon Williams MRN: [id number] Date of Birth: 1971/06/20

## 2020-08-13 NOTE — Patient Instructions (Signed)
Access Code: [email or web address] URL: [email or web address] Date: 08/13/2020 Prepared by: Rudell Cobb  Exercises Supine Thoracic Mobilization Towel Roll Vertical with Arm Stretch - 2 x daily - 7 x weekly - 1 sets - 1 reps - 2 minutes hold Supine Thoracic Mobilization Foam Roll Horizontal with Arm Stretch - 2 x daily - 7 x weekly - 1 sets - 1 reps - 1-2 minutes hold Doorway Pec Stretch at 60 Elevation - 2 x daily - 7 x weekly - 1 sets - 3 reps Standing Pec Stretch at Wall - 2 x daily - 7 x weekly - 1 sets - 2-3 reps - 30 seconds hold Standing with Forearms Thoracic Rotation - 2 x daily - 7 x weekly - 1 sets - 10 reps Standing Row with Resistance with Anchored Resistance at Chest Height Palms Down - 2 x daily - 7 x weekly - 1 sets - 10 reps Single Arm Scaption with Dumbbell - 2 x daily - 7 x weekly - 1 sets - 10-15 reps Seated Shoulder External Rotation in Abduction Supported with Dumbbell - 2 x daily - 7 x weekly - 1 sets - 10 reps Shoulder Overhead Press in Flexion with Dumbbells - 2 x daily - 7 x weekly - 1 sets - 10 reps  Patient Education [place] Patient Instructions

## 2020-08-14 MED ORDER — ATORVASTATIN CALCIUM 10 MG PO TABS: 10.0000 mg | ORAL_TABLET | Freq: Every day | ORAL | 0 refills | Status: DC

## 2020-08-14 NOTE — Telephone Encounter (Signed)
Scheduled appointment for 2/10

## 2020-08-16 ENCOUNTER — Ambulatory Visit (INDEPENDENT_AMBULATORY_CARE_PROVIDER_SITE_OTHER): Payer: BC Managed Care – PPO | Admitting: Family Medicine

## 2020-08-16 ENCOUNTER — Other Ambulatory Visit: Payer: Self-pay | Admitting: Family Medicine

## 2020-08-16 ENCOUNTER — Encounter: Payer: Self-pay | Admitting: Family Medicine

## 2020-08-16 VITALS — BP 136/82 | HR 73 | Temp 97.2°F | Ht 69.0 in | Wt 228.8 lb

## 2020-08-16 DIAGNOSIS — E785 Hyperlipidemia, unspecified: Secondary | ICD-10-CM

## 2020-08-16 DIAGNOSIS — K760 Fatty (change of) liver, not elsewhere classified: Secondary | ICD-10-CM

## 2020-08-16 DIAGNOSIS — E039 Hypothyroidism, unspecified: Secondary | ICD-10-CM

## 2020-08-16 MED ORDER — ATORVASTATIN CALCIUM 10 MG PO TABS
10.0000 mg | ORAL_TABLET | Freq: Every day | ORAL | 0 refills | Status: DC
Start: 2020-08-16 — End: 2021-03-18

## 2020-08-16 MED ORDER — LEVOTHYROXINE SODIUM 75 MCG PO TABS: ORAL_TABLET | ORAL | 1 refills | Status: DC

## 2020-08-16 NOTE — Patient Instructions (Addendum)
Follow-up in 6 months get labs before next visit.

## 2020-08-16 NOTE — Progress Notes (Signed)
Labs for 6 month follow-up.

## 2020-08-16 NOTE — Progress Notes (Signed)
Patient ID: UMBERTO PAVEK, male    DOB: 1971-07-07, 50 y.o.   MRN: [id number]   Chief Complaint  Patient presents with  . Hyperlipidemia    Patient states he is doing well with no problems or concerns   Subjective:  CC: medication management for hypothryoid and HLD  Presents today for medication management for hypothyroidism, hyperlipidemia.  Reports that he is tolerating his medications well, taking them as prescribed.  He was diagnosed with fatty liver in December 2018 by ultrasound.  He needs refills today.  His last labs were done in October 2021 will repeat today.  Denies fever, chills, chest pain, shortness of breath, leg swelling, muscle aches.    Medical History [place] has a past medical history of Allergic rhinitis, Allergy, Family history of malignant hyperthermia, and Hyperlipidemia.   Outpatient Encounter Medications as of 08/16/2020  Medication Sig  . atorvastatin (LIPITOR) 10 MG tablet Take 1 tablet (10 mg total) by mouth at bedtime.  Marland Kitchen glucosamine-chondroitin 500-400 MG tablet Take 1 tablet by mouth daily.  Marland Kitchen KRILL OIL PO Take 500 mg by mouth daily.  Marland Kitchen levothyroxine (SYNTHROID) 75 MCG tablet TAKE 1 TABLET BY MOUTH BEFORE BREAKFAST  . meloxicam (MOBIC) 15 MG tablet One tab PO qAM with a meal for 2 weeks, then daily prn pain.  . Multiple Vitamin (MULTI VITAMIN MENS PO) Take 1 tablet by mouth daily.  . [DISCONTINUED] atorvastatin (LIPITOR) 10 MG tablet Take 1 tablet (10 mg total) by mouth at bedtime.  . [DISCONTINUED] levothyroxine (SYNTHROID) 75 MCG tablet TAKE 1 TABLET BY MOUTH BEFORE BREAKFAST   No facility-administered encounter medications on file as of 08/16/2020.     Review of Systems  Constitutional: Negative for chills and fever.  HENT: Negative for congestion.   Respiratory: Negative for cough, chest tightness and shortness of breath.   Cardiovascular: Negative for chest pain and leg swelling.  Gastrointestinal: Negative for diarrhea, nausea and vomiting.   Genitourinary: Negative for dysuria.  Musculoskeletal: Negative for back pain, joint swelling and myalgias.  Skin: Negative for rash.  Neurological: Negative for dizziness, light-headedness and headaches.     Vitals BP 136/82   Pulse 73   Temp (!) 97.2 F (36.2 C) (Oral)   Ht 5\' 9"  (1.753 m)   Wt 228 lb 12.8 oz (103.8 kg)   SpO2 97%   BMI 33.79 kg/m   Objective:   Physical Exam Vitals reviewed.  Cardiovascular:     Rate and Rhythm: Normal rate and regular rhythm.     Heart sounds: Normal heart sounds.  Pulmonary:     Effort: Pulmonary effort is normal.     Breath sounds: Normal breath sounds.  Skin:    General: Skin is warm and dry.  Neurological:     General: No focal deficit present.     Mental Status: He is alert.  Psychiatric:        Behavior: Behavior normal.      Assessment and Plan   1. Hypothyroidism, unspecified type - TSH - levothyroxine (SYNTHROID) 75 MCG tablet; TAKE 1 TABLET BY MOUTH BEFORE BREAKFAST  Dispense: 90 tablet; Refill: 1  2. Hyperlipidemia LDL goal <100 - Lipid Profile - atorvastatin (LIPITOR) 10 MG tablet; Take 1 tablet (10 mg total) by mouth at bedtime.  Dispense: 90 tablet; Refill: 0  3. Fatty liver disease, nonalcoholic - Comprehensive Metabolic Panel (CMET)    Hyperlipidemia:  Medication compliance: taking statin daily as prescribed.  Denies chest pain, shortness of breath, lower extremity  edema, myalgias, (aches, soreness, stiffness, tenderness, cramps), muscle weakness, changes in appearance of urine. Pertinent lab work: will get today Monitoring: every 6 months Continue current medication regimen: continue atorvastatin 10 mg as prescribed.    Hypothyroid:  Medication compliance: takes daily as prescribed.  Denies chest pain, shortness of breath, low energy, hair loss, constipation.  Pertinent lab work: 11/17/19- TSH 3.4 (will get today).  Monitoring: every 6 months.  Continue current medication regimen:continue  levothyroxine 75 mcg daily on empty stomache before other meds.   Discussed fatty liver and lifestyle with diet and exercise. Avoids alcohol intake.   [name]. [name]: chest pain, shortness of breath, any significant change in health.  Understands to follow-up in six months with labs prior to visit. Refills sent today.    Pecolia Ades, NP 08/16/20

## 2020-08-17 LAB — COMPREHENSIVE METABOLIC PANEL
ALT: 59 IU/L — ABNORMAL HIGH (ref 0–44)
AST: 52 IU/L — ABNORMAL HIGH (ref 0–40)
Albumin/Globulin Ratio: 1.9 (ref 1.2–2.2)
Albumin: 4.9 g/dL (ref 4.0–5.0)
Alkaline Phosphatase: 64 IU/L (ref 44–121)
BUN/Creatinine Ratio: 13 (ref 9–20)
BUN: 14 mg/dL (ref 6–24)
Bilirubin Total: 0.6 mg/dL (ref 0.0–1.2)
CO2: 21 mmol/L (ref 20–29)
Calcium: 9.2 mg/dL (ref 8.7–10.2)
Chloride: 101 mmol/L (ref 96–106)
Creatinine, Ser: 1.08 mg/dL (ref 0.76–1.27)
GFR calc Af Amer: 93 mL/min/{1.73_m2} (ref >59)
GFR calc non Af Amer: 80 mL/min/{1.73_m2} (ref >59)
Globulin, Total: 2.6 g/dL (ref 1.5–4.5)
Glucose: 94 mg/dL (ref 65–99)
Potassium: 4.5 mmol/L (ref 3.5–5.2)
Sodium: 140 mmol/L (ref 134–144)
Total Protein: 7.5 g/dL (ref 6.0–8.5)

## 2020-08-17 LAB — TSH: TSH: 2.99 u[IU]/mL (ref 0.450–4.500)

## 2020-08-17 LAB — LIPID PANEL
Chol/HDL Ratio: 4.8 ratio (ref 0.0–5.0)
Cholesterol, Total: 179 mg/dL (ref 100–199)
HDL: 37 mg/dL — ABNORMAL LOW (ref >39)
LDL Chol Calc (NIH): 112 mg/dL — ABNORMAL HIGH (ref 0–99)
Triglycerides: 170 mg/dL — ABNORMAL HIGH (ref 0–149)
VLDL Cholesterol Cal: 30 mg/dL (ref 5–40)

## 2020-08-20 ENCOUNTER — Other Ambulatory Visit: Payer: Self-pay

## 2020-08-20 ENCOUNTER — Ambulatory Visit: Payer: BC Managed Care – PPO | Admitting: Sports Medicine

## 2020-08-20 DIAGNOSIS — M7541 Impingement syndrome of right shoulder: Secondary | ICD-10-CM | POA: Diagnosis not present

## 2020-08-20 DIAGNOSIS — M7521 Bicipital tendinitis, right shoulder: Secondary | ICD-10-CM | POA: Diagnosis not present

## 2020-08-20 NOTE — Assessment & Plan Note (Addendum)
Unfortunately Brandon Williams is having increasing pain in his right shoulder, he has a positive Neer test, minimally positive Hawkins test, minimally positive empty can sign. No biceps signs referable to the shoulder. He has been doing a lot of overhead activities, he will stop this for now, and return to see me if still having discomfort in a month and we will do a subacromial injection.  Of note he also plans to establish care with Dr. Zigmund Daniel.

## 2020-08-20 NOTE — Progress Notes (Signed)
    Procedures performed today:    None.  Independent interpretation of notes and tests performed by another provider:   None.  Brief History, Exam, Impression, and Recommendations:    Distal right biceps tendinitis This is a pleasant 50 year old male, he had 3 months of pain, referable to the distal biceps with a positive Yergason and speeds test that refers to the radial tuberosity. We injected his distal biceps insertion, he returns today after a good 6+ weeks of formal physical therapy with no further pain at his elbow.   Impingement syndrome, shoulder, right Unfortunately Brandon Williams is having increasing pain in his right shoulder, he has a positive Neer test, minimally positive Hawkins test, minimally positive empty can sign. No biceps signs referable to the shoulder. He has been doing a lot of overhead activities, he will stop this for now, and return to see me if still having discomfort in a month and we will do a subacromial injection.  Of note he also plans to establish care with Dr. Zigmund Daniel.    ___________________________________________ Gwen Her. Dianah Field, M.D., ABFM., CAQSM. Primary Care and [place] Instructor of [place] [employer] [facility]

## 2020-08-20 NOTE — Assessment & Plan Note (Signed)
This is a pleasant 50 year old male, he had 3 months of pain, referable to the distal biceps with a positive Yergason and speeds test that refers to the radial tuberosity. We injected his distal biceps insertion, he returns today after a good 6+ weeks of formal physical therapy with no further pain at his elbow.

## 2020-08-22 ENCOUNTER — Ambulatory Visit (INDEPENDENT_AMBULATORY_CARE_PROVIDER_SITE_OTHER): Payer: BC Managed Care – PPO | Admitting: Rehabilitative and Restorative Service Providers"

## 2020-08-22 ENCOUNTER — Other Ambulatory Visit: Payer: Self-pay

## 2020-08-22 ENCOUNTER — Encounter: Payer: Self-pay | Admitting: Rehabilitative and Restorative Service Providers"

## 2020-08-22 DIAGNOSIS — R29898 Other symptoms and signs involving the musculoskeletal system: Secondary | ICD-10-CM | POA: Diagnosis not present

## 2020-08-22 DIAGNOSIS — M25511 Pain in right shoulder: Secondary | ICD-10-CM

## 2020-08-22 NOTE — Therapy (Addendum)
[place], [place], [address] Phone: [phone]   Fax:  [phone]  Physical Therapy Treatment/ On-Hold Note/ Discharge Summary  Patient Details  Name: Brandon Williams MRN: [id number] Date of Birth: 01/09/1971 Referring Provider (PT): Aundria Mems, MD  PHYSICAL THERAPY DISCHARGE SUMMARY  Visits from Start of Care: 6  Current functional level related to goals / functional outcomes: See note below for current patient status.   Remaining deficits: See note below for patient status.   Education / Equipment: HEP  Plan: Patient agrees to discharge.  Patient goals were partially met. Patient is being discharged due to not returning since the last visit.  ?????           Encounter Date: 08/22/2020   PT End of Session - 08/22/20 0835    Visit Number 6    Number of Visits 8    Date for PT Re-Evaluation 08/29/20    Authorization Type BCBS    PT Start Time 0800    PT Stop Time 0830    PT Time Calculation (min) 30 min    Activity Tolerance Patient tolerated treatment well    Behavior During Therapy [facility] for tasks assessed/performed           Past Medical History:  Diagnosis Date  . Allergic rhinitis   . Allergy   . Family history of malignant hyperthermia    paternal grandmother and paternal uncle died in OR from [facility]  . Hyperlipidemia     Past Surgical History:  Procedure Laterality Date  . BACK SURGERY    . thumb surgery      There were no vitals filed for this visit.   Subjective Assessment - 08/22/20 0803    Subjective The patient reports elbow pain is significantly improved, but the anterior/superior shoulder gets occasional pain.  He is avoiding overhead lifting tasks at this time.  He woke up last night with arm overhead with sleeping and had shoulder pain.  He still feels ongoing tightness R anterior shoulder/pec.    Patient Stated Goals Be able to unload supplies  for work    Currently in Pain? No/denies              [facility] PT Assessment - 08/22/20 0807      Assessment   Medical Diagnosis R biceps tendonitis    Referring Provider (PT) Aundria Mems, MD    Onset Date/Surgical Date 06/11/20                         [facility] Adult PT Treatment/Exercise - 08/22/20 0807      Exercises   Exercises Shoulder      Shoulder Exercises: Supine   Horizontal ABduction Strengthening;Both;10 reps    Theraband Level (Shoulder Horizontal ABduction) Level 3 (Green)    Other Supine Exercises neura glide in supine with horizontal abduction + wrist extension      Shoulder Exercises: Standing   Flexion Strengthening;Right;12 reps    Shoulder Flexion Weight (lbs) 3 lbs    ABduction Strengthening;Right;12 reps    Shoulder ABduction Weight (lbs) 3 lbs    ABduction Limitations scaption    Row Strengthening;Both;12 reps    Theraband Level (Shoulder Row) Level 4 (Blue)      Shoulder Exercises: Stretch   Other Shoulder Stretches pec stretch at wall      Manual Therapy   Manual Therapy Soft tissue mobilization    Manual therapy comments  Taught self soft tissue massage with ball on wall for anterior shoulder and pecs    Soft tissue mobilization STM pectoralis musculature                  PT Education - 08/22/20 0830    Education Details HEP    Person(s) Educated Patient    Methods Explanation;Demonstration;Handout    Comprehension Verbalized understanding;Returned demonstration               PT Long Term Goals - 08/22/20 0836      PT LONG TERM GOAL #1   Title The patient will be independent with progression of HEP.    Time 4    Period Weeks    Status Achieved      PT LONG TERM GOAL #2   Title The patient will reduce limitaiton per FOTO from 31 to 24% limitation.    Baseline 26% limitation    Time 4    Period Weeks    Status Partially Met                 Plan - 08/22/20 0837    Clinical Impression  Statement The patient does not have daily pain in the R UE and pain he initially sought PT for is resolved.  However, with ther ex and progression of bands and diagonal pattern strengthening, the patient does have occasional end range pain with shoulder flexion (+ Neer test).  PT modified HEP last visit reducing strengthening to straight planes and d/c'ing diagonal band exercises.  Patient plans to continue with current program and f/u in 4 weeks with MD if pain continues.  We discussed pec release for home and progression of ther ex as he can tolerate.  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    PT Treatment/Interventions ADLs/Self Care Home Management;Therapeutic activities;Therapeutic exercise;Manual techniques;Patient/family education;Taping;Dry needling;Passive range of motion;Iontophoresis 82m/ml Dexamethasone;Cryotherapy;Moist Heat    PT Next Visit Plan On Hold at this time-- will call if symptoms increase, or he is having difficulty with lifting @ work.    PT Home Exercise Plan Access Code: [email or web address]   Consulted and Agree with Plan of Care Patient           Patient will benefit from skilled therapeutic intervention in order to improve the following deficits and impairments:     Visit Diagnosis: Other symptoms and signs involving the musculoskeletal system  Acute pain of right shoulder     Problem List Patient Active Problem List   Diagnosis Date Noted  . Fatty liver disease, nonalcoholic 064/40/3474 . Distal right biceps tendinitis 06/11/2020  . Impingement syndrome, shoulder, right 05/18/2020  . Snoring 05/18/2020  . Muscle strain 04/18/2020  . Hyperlipidemia LDL goal <100 09/11/2018  . Hypothyroidism 09/11/2018  . Elevated liver enzymes 12/09/2012  . Seasonal allergies 11/21/2012    Arminda Foglio, PT 08/22/2020, 8:42  AM  [facility] [address]Phone: [phone]  Fax:  [phone] Name: Brandon MEDDAUGHMRN: [id number] of Birth: [birth year]

## 2020-08-22 NOTE — Patient Instructions (Signed)
Access Code: [email or web address] URL: [email or web address] Date: 08/22/2020 Prepared by: Rudell Cobb  Exercises Supine Thoracic Mobilization Towel Roll Vertical with Arm Stretch - 2 x daily - 7 x weekly - 1 sets - 1 reps - 2 minutes hold Supine Thoracic Mobilization Foam Roll Horizontal with Arm Stretch - 2 x daily - 7 x weekly - 1 sets - 1 reps - 1-2 minutes hold Sidelying Thoracic Rotation with Open Book - 2 x daily - 7 x weekly - 1 sets - 10 reps Doorway Pec Stretch at 60 Elevation - 2 x daily - 7 x weekly - 1 sets - 3 reps Standing Pec Stretch at Wall - 2 x daily - 7 x weekly - 1 sets - 2-3 reps - 30 seconds hold Standing with Forearms Thoracic Rotation - 2 x daily - 7 x weekly - 1 sets - 10 reps Standing Row with Resistance with Anchored Resistance at Chest Height Palms Down - 2 x daily - 7 x weekly - 1 sets - 10 reps Single Arm Scaption with Dumbbell - 2 x daily - 7 x weekly - 1 sets - 10-15 reps Seated Shoulder External Rotation in Abduction Supported with Dumbbell - 2 x daily - 7 x weekly - 1 sets - 10 reps Chest Mobilization with Small Ball - 2 x daily - 7 x weekly - 1 sets - 1 reps

## 2020-08-23 ENCOUNTER — Ambulatory Visit (INDEPENDENT_AMBULATORY_CARE_PROVIDER_SITE_OTHER): Payer: BC Managed Care – PPO | Admitting: Family Medicine

## 2020-08-23 ENCOUNTER — Encounter: Payer: Self-pay | Admitting: Family Medicine

## 2020-08-23 ENCOUNTER — Other Ambulatory Visit: Payer: Self-pay

## 2020-08-23 VITALS — BP 135/83 | HR 69 | Temp 98.2°F | Ht 69.49 in | Wt 228.4 lb

## 2020-08-23 DIAGNOSIS — E785 Hyperlipidemia, unspecified: Secondary | ICD-10-CM

## 2020-08-23 DIAGNOSIS — E039 Hypothyroidism, unspecified: Secondary | ICD-10-CM

## 2020-08-23 NOTE — Assessment & Plan Note (Signed)
Doing well with current dose of atorvastatin, will continue.

## 2020-08-23 NOTE — Assessment & Plan Note (Signed)
Lab Results  Component Value Date   TSH 2.990 08/16/2020  Doing well with current dose of levothyroxine.  Continue and recheck TSH in 6 months.

## 2020-08-23 NOTE — Patient Instructions (Signed)
Great to meet you today! Continue current medications.  See me again in about 6 months.

## 2020-08-23 NOTE — Progress Notes (Signed)
Brandon Williams - 50 y.o. male MRN [id number]  Date of birth: Nov 09, 1970  Subjective Chief Complaint  Patient presents with  . Establish Care    HPI Brandon Williams is a 50 y.o. male here today for initial visit.  He has a history of HLD and hypothyroidism.  He has also seen Dr. Dianah Field for musculoskeletal issues with his elbow and shoulder.   Previous records and labs reviewed and had labs completed last week at previous pcp.  TSH wnl and feels good at current dose of levothyroxine.    Lipids managed with atorvastatin 10mg  daily.  Has family history of CAD.  Working on dietary changes as well to improve lipids.  Denies side effects related to medication.   Lab Results  Component Value Date   LDLCALC 112 (H) 08/16/2020   ROS:  A comprehensive ROS was completed and negative except as noted per HPI  No Known Allergies  Past Medical History:  Diagnosis Date  . Allergic rhinitis   . Allergy   . Family history of malignant hyperthermia    paternal grandmother and paternal uncle died in OR from [facility]  . Hyperlipidemia   . Thyroid disease     Past Surgical History:  Procedure Laterality Date  . BACK SURGERY    . thumb surgery      Social History   Socioeconomic History  . Marital status: Married    Spouse name: Not on file  . Number of children: Not on file  . Years of education: Not on file  . Highest education level: Not on file  Occupational History  . Not on file  Tobacco Use  . Smoking status: Never Smoker  . Smokeless tobacco: Never Used  Vaping Use  . Vaping Use: Never used  Substance and Sexual Activity  . Alcohol use: Yes    Alcohol/week: 1.0 - 2.0 standard drink    Types: 1 - 2 Standard drinks or equivalent per week  . Drug use: Never  . Sexual activity: Yes  Other Topics Concern  . Not on file  Social History Narrative  . Not on file   Social Determinants of Health   Financial Resource Strain: Not on file  Food Insecurity: Not on file   Transportation Needs: Not on file  Physical Activity: Not on file  Stress: Not on file  Social Connections: Not on file    Family History  Problem Relation Age of Onset  . Heart disease Father   . Heart disease Paternal Grandfather   . Colon cancer Neg Hx   . Esophageal cancer Neg Hx   . Stomach cancer Neg Hx   . Rectal cancer Neg Hx     Health Maintenance  Topic Date Due  . HIV Screening  Never done  . COLONOSCOPY (Pts 45-62yrs Insurance coverage will need to be confirmed)  03/08/2025  . TETANUS/TDAP  01/22/2027  . INFLUENZA VACCINE  Completed  . COVID-19 Vaccine  Completed  . Hepatitis C Screening  Completed     ----------------------------------------------------------------------------------------------------------------------------------------------------------------------------------------------------------------- Physical Exam BP 135/83 (BP Location: Left Arm, Patient Position: Sitting, Cuff Size: Normal)   Pulse 69   Temp 98.2 F (36.8 C)   Ht 5' 9.49" (1.765 m)   Wt 228 lb 6.4 oz (103.6 kg)   SpO2 95%   BMI 33.26 kg/m   Physical Exam Constitutional:      Appearance: Normal appearance.  HENT:     Head: Normocephalic and atraumatic.  Cardiovascular:     Rate  and Rhythm: Normal rate and regular rhythm.  Pulmonary:     Effort: Pulmonary effort is normal.     Breath sounds: Normal breath sounds.  Musculoskeletal:     Cervical back: Neck supple.  Neurological:     General: No focal deficit present.     Mental Status: He is alert.  Psychiatric:        Mood and Affect: Mood normal.        Behavior: Behavior normal.     ------------------------------------------------------------------------------------------------------------------------------------------------------------------------------------------------------------------- Assessment and Plan  Hypothyroidism Lab Results  Component Value Date   TSH 2.990 08/16/2020  Doing well with current dose  of levothyroxine.  Continue and recheck TSH in 6 months.   Hyperlipidemia LDL goal <100 Doing well with current dose of atorvastatin, will continue.    No orders of the defined types were placed in this encounter.   Return in about 6 months (around 02/20/2021) for thyroid.    This visit occurred during the SARS-CoV-2 public health emergency.  Safety protocols were in place, including screening questions prior to the visit, additional usage of staff PPE, and extensive cleaning of exam room while observing appropriate contact time as indicated for disinfecting solutions.

## 2021-02-12 ENCOUNTER — Ambulatory Visit (INDEPENDENT_AMBULATORY_CARE_PROVIDER_SITE_OTHER): Payer: BC Managed Care – PPO | Admitting: Family Medicine

## 2021-02-12 ENCOUNTER — Encounter: Payer: Self-pay | Admitting: Family Medicine

## 2021-02-12 ENCOUNTER — Other Ambulatory Visit: Payer: Self-pay

## 2021-02-12 DIAGNOSIS — E039 Hypothyroidism, unspecified: Secondary | ICD-10-CM | POA: Diagnosis not present

## 2021-02-12 DIAGNOSIS — R0683 Snoring: Secondary | ICD-10-CM

## 2021-02-12 DIAGNOSIS — E785 Hyperlipidemia, unspecified: Secondary | ICD-10-CM | POA: Diagnosis not present

## 2021-02-12 DIAGNOSIS — M5412 Radiculopathy, cervical region: Secondary | ICD-10-CM | POA: Diagnosis not present

## 2021-02-12 LAB — TSH: TSH: 4 m[IU]/L (ref 0.40–4.50)

## 2021-02-12 MED ORDER — PREDNISONE 50 MG PO TABS: ORAL_TABLET | ORAL | 0 refills | Status: DC

## 2021-02-12 NOTE — Assessment & Plan Note (Signed)
He did have TSH drawn this morning.  We will follow-up with him once results return and let her know if we need to make adjustments to his levothyroxine.

## 2021-02-12 NOTE — Patient Instructions (Signed)
Cervical Radiculopathy  Cervical radiculopathy means that a nerve in the neck (a cervical nerve) is pinched or bruised. This can happen because of an injury to the cervical spine (vertebrae) in the neck, or as a normal part of getting older. This can cause pain or loss of feeling (numbness) that runs from your neck all the way down to your arm and fingers. Often, this condition gets better with rest. Treatment may be needed if the conditiondoes not get better. What are the causes? A neck injury. A bulging disk in your spine. Muscle movements that you cannot control (muscle spasms). Tight muscles in your neck due to overuse. Arthritis. Breakdown in the bones and joints of the spine (spondylosis) due to getting older. Bone spurs that form near the nerves in the neck. What are the signs or symptoms? Pain. The pain may: Run from the neck to the arm and hand. Be very bad or irritating. Be worse when you move your neck. Loss of feeling or tingling in your arm or hand. Weakness in your arm or hand, in very bad cases. How is this treated? In many cases, treatment is not needed for this condition. With rest, the condition often gets better over time. If treatment is needed, options may include: Wearing a soft neck collar (cervical collar) for short periods of time, as told by your doctor. Doing exercises (physical therapy) to strengthen your neck muscles. Taking medicines. Having shots (injections) in your spine, in very bad cases. Having surgery. This may be needed if other treatments do not help. The type of surgery that is used depends on the cause of your condition. Follow these instructions at home: If you have a soft neck collar: Wear it as told by your doctor. Remove it only as told by your doctor. Ask your doctor if you can remove the collar for cleaning and bathing. If you are allowed to remove the collar for cleaning or bathing: Follow instructions from your doctor about how to remove  the collar safely. Clean the collar by wiping it with mild soap and water and drying it completely. Take out any removable pads in the collar every 1-2 days. Wash them by hand with soap and water. Let them air-dry completely before you put them back in the collar. Check your skin under the collar for redness or sores. If you see any, tell your doctor. Managing pain     Take over-the-counter and prescription medicines only as told by your doctor. If told, put ice on the painful area. If you have a soft neck collar, remove it as told by your doctor. Put ice in a plastic bag. Place a towel between your skin and the bag. Leave the ice on for 20 minutes, 2-3 times a day. If using ice does not help, you can try using heat. Use the heat source that your doctor recommends, such as a moist heat pack or a heating pad. Place a towel between your skin and the heat source. Leave the heat on for 20-30 minutes. Remove the heat if your skin turns bright red. This is very important if you are unable to feel pain, heat, or cold. You may have a greater risk of getting burned. You may try a gentle neck and shoulder rub (massage). Activity Rest as needed. Return to your normal activities as told by your doctor. Ask your doctor what activities are safe for you. Do exercises as told by your doctor or physical therapist. Do not lift anything that   is heavier than 10 lb (4.5 kg) until your doctor tells you that it is safe. General instructions Use a flat pillow when you sleep. Do not drive while wearing a soft neck collar. If you do not have a soft neck collar, ask your doctor if it is safe to drive while your neck heals. Ask your doctor if the medicine prescribed to you requires you to avoid driving or using heavy machinery. Do not use any products that contain nicotine or tobacco, such as cigarettes, e-cigarettes, and chewing tobacco. These can delay healing. If you need help quitting, ask your doctor. Keep all  follow-up visits as told by your doctor. This is important. Contact a doctor if: Your condition does not get better with treatment. Get help right away if: Your pain gets worse and is not helped with medicine. You lose feeling or feel weak in your hand, arm, face, or leg. You have a high fever. You have a stiff neck. You cannot control when you poop or pee (have incontinence). You have trouble with walking, balance, or talking. Summary Cervical radiculopathy means that a nerve in the neck is pinched or bruised. A nerve can get pinched from a bulging disk, arthritis, an injury to the neck, or other causes. Symptoms include pain, tingling, or loss of feeling that goes from the neck into the arm or hand. Weakness in your arm or hand can happen in very bad cases. Treatment may include resting, wearing a soft neck collar, and doing exercises. You might need to take medicines for pain. In very bad cases, shots or surgery may be needed. This information is not intended to replace advice given to you by your health care provider. Make sure you discuss any questions you have with your healthcare provider. Document Revised: 05/14/2018 Document Reviewed: 05/14/2018 Elsevier Patient Education  2022 [employer]  

## 2021-02-12 NOTE — Assessment & Plan Note (Signed)
Tolerating atorvastatin well.  Recommend continuation.

## 2021-02-12 NOTE — Progress Notes (Addendum)
BRANON PETROSINO - 50 y.o. male MRN [id number]  Date of birth: Dec 09, 1970  Subjective Chief Complaint  Patient presents with   Hypothyroidism    HPI Nicole Kindred is a 50 year old male here today for a follow-up visit.  He has history of hypothyroidism.  He did have labs drawn this morning for updated TSH.  He feels good at current dose of levothyroxine.  He is having some radiating pain from the neck with radiation into the right shoulder and arm.  He has seen Dr. Dianah Field for for elbow pain and had injection.  He has had some improvement with this briefly but is now having some additional elbow pain.  He does get occasional pins-and-needles sensation in the hand.  He denies any weakness.  He did recently see a chiropractor for some tightness in his upper back and shoulder blade area.  X-ray showed some degenerative changes at C6/C7.  He has also noted some snoring and daytime fatigue.  He has never had a sleep study.   ROS:  A comprehensive ROS was completed and negative except as noted per HPI  No Known Allergies  Past Medical History:  Diagnosis Date   Allergic rhinitis    Allergy    Family history of malignant hyperthermia    paternal grandmother and paternal uncle died in OR from [place]   Hyperlipidemia    Thyroid disease     Past Surgical History:  Procedure Laterality Date   BACK SURGERY     thumb surgery      Social History   Socioeconomic History   Marital status: Married    Spouse name: Not on file   Number of children: Not on file   Years of education: Not on file   Highest education level: Not on file  Occupational History   Not on file  Tobacco Use   Smoking status: Never   Smokeless tobacco: Never  Vaping Use   Vaping Use: Never used  Substance and Sexual Activity   Alcohol use: Yes    Alcohol/week: 1.0 - 2.0 standard drink    Types: 1 - 2 Standard drinks or equivalent per week   Drug use: Never   Sexual activity: Yes  Other Topics Concern   Not on file   Social History Narrative   Not on file   Social Determinants of Health   Financial Resource Strain: Not on file  Food Insecurity: Not on file  Transportation Needs: Not on file  Physical Activity: Not on file  Stress: Not on file  Social Connections: Not on file    Family History  Problem Relation Age of Onset   Heart disease Father    Heart disease Paternal Grandfather    Colon cancer Neg Hx    Esophageal cancer Neg Hx    Stomach cancer Neg Hx    Rectal cancer Neg Hx     Health Maintenance  Topic Date Due   Pneumococcal Vaccine 68-39 Years old (1 - PCV) Never done   HIV Screening  Never done   COVID-19 Vaccine (4 - Booster for Moderna series) 08/06/2020   INFLUENZA VACCINE  02/04/2021   COLONOSCOPY (Pts 45-64yr Insurance coverage will need to be confirmed)  03/08/2025   TETANUS/TDAP  01/22/2027   Hepatitis C Screening  Completed   HPV VACCINES  Aged Out     ----------------------------------------------------------------------------------------------------------------------------------------------------------------------------------------------------------------- Physical Exam BP 129/88 (BP Location: Left Arm, Patient Position: Sitting, Cuff Size: Normal)   Pulse 64   Temp 97.8 F (36.6 C)  Ht '5\' 9"'$  (1.753 m)   Wt 228 lb 1.9 oz (103.5 kg)   SpO2 95%   BMI 33.69 kg/m   Physical Exam Constitutional:      Appearance: Normal appearance.  Eyes:     General: No scleral icterus. Cardiovascular:     Rate and Rhythm: Normal rate and regular rhythm.  Pulmonary:     Effort: Pulmonary effort is normal.     Breath sounds: Normal breath sounds.  Musculoskeletal:     Cervical back: Neck supple.  Skin:    General: Skin is warm and dry.  Neurological:     General: No focal deficit present.     Mental Status: He is alert.  Psychiatric:        Mood and Affect: Mood normal.        Behavior: Behavior normal.     ------------------------------------------------------------------------------------------------------------------------------------------------------------------------------------------------------------------- Assessment and Plan  Hypothyroidism He did have TSH drawn this morning.  We will follow-up with him once results return and let her know if we need to make adjustments to his levothyroxine.  Hyperlipidemia LDL goal <100 Tolerating atorvastatin well.  Recommend continuation.  Cervical radiculitis This is a symptoms that are consistent with cervical radiculitis.  Start prednisone 50 mg daily x5 days.  Discussed that if not improving we can Add formal physical therapy and consider MRI for interventional planning if needed.  Snoring Heavy snoring with daytime fatigue.  Concern for OSA. Referral entered for sleep study.    Meds ordered this encounter  Medications   predniSONE (DELTASONE) 50 MG tablet    Sig: Take 1 tab po daily x5 days    Dispense:  5 tablet    Refill:  0    No follow-ups on file.    This visit occurred during the SARS-CoV-2 public health emergency.  Safety protocols were in place, including screening questions prior to the visit, additional usage of staff PPE, and extensive cleaning of exam room while observing appropriate contact time as indicated for disinfecting solutions.

## 2021-02-12 NOTE — Assessment & Plan Note (Signed)
This is a symptoms that are consistent with cervical radiculitis.  Start prednisone 50 mg daily x5 days.  Discussed that if not improving we can Add formal physical therapy and consider MRI for interventional planning if needed.

## 2021-02-13 ENCOUNTER — Ambulatory Visit: Payer: BC Managed Care – PPO | Admitting: Family Medicine

## 2021-02-20 ENCOUNTER — Ambulatory Visit: Payer: BC Managed Care – PPO | Admitting: Family Medicine

## 2021-03-12 ENCOUNTER — Encounter: Payer: Self-pay | Admitting: Family Medicine

## 2021-03-12 ENCOUNTER — Other Ambulatory Visit: Payer: Self-pay | Admitting: Family Medicine

## 2021-03-12 DIAGNOSIS — R0683 Snoring: Secondary | ICD-10-CM

## 2021-03-12 DIAGNOSIS — E785 Hyperlipidemia, unspecified: Secondary | ICD-10-CM

## 2021-03-12 DIAGNOSIS — E039 Hypothyroidism, unspecified: Secondary | ICD-10-CM

## 2021-03-13 ENCOUNTER — Other Ambulatory Visit: Payer: Self-pay

## 2021-03-13 DIAGNOSIS — R0683 Snoring: Secondary | ICD-10-CM

## 2021-03-18 ENCOUNTER — Other Ambulatory Visit: Payer: Self-pay

## 2021-03-18 DIAGNOSIS — E785 Hyperlipidemia, unspecified: Secondary | ICD-10-CM

## 2021-03-18 MED ORDER — ATORVASTATIN CALCIUM 10 MG PO TABS: 10.0000 mg | ORAL_TABLET | Freq: Every day | ORAL | 0 refills | Status: DC

## 2021-04-12 NOTE — Assessment & Plan Note (Signed)
Heavy snoring with daytime fatigue.  Concern for OSA. Referral entered for sleep study.

## 2021-04-13 ENCOUNTER — Encounter: Payer: Self-pay | Admitting: Family Medicine

## 2021-04-18 NOTE — Telephone Encounter (Signed)
Sleep study did not see in office note anything about snoring or fatigue. Dr. Zigmund Daniel has now addended the note and stated the conversation of snoring in their. I have messaged Mattie to let her know this has been taken care of to please call and schedule patient. I have also called patient to let him know that he should be hearing from the sleep center and if he does not to give me a call back and I will call them. - CF

## 2021-04-25 ENCOUNTER — Other Ambulatory Visit: Payer: Self-pay | Admitting: Family Medicine

## 2021-04-25 DIAGNOSIS — E039 Hypothyroidism, unspecified: Secondary | ICD-10-CM

## 2021-04-29 ENCOUNTER — Other Ambulatory Visit: Payer: Self-pay | Admitting: Family Medicine

## 2021-04-29 DIAGNOSIS — E039 Hypothyroidism, unspecified: Secondary | ICD-10-CM

## 2021-05-01 ENCOUNTER — Other Ambulatory Visit: Payer: Self-pay | Admitting: Family Medicine

## 2021-05-01 DIAGNOSIS — E039 Hypothyroidism, unspecified: Secondary | ICD-10-CM

## 2021-05-03 ENCOUNTER — Other Ambulatory Visit: Payer: Self-pay

## 2021-05-03 DIAGNOSIS — E039 Hypothyroidism, unspecified: Secondary | ICD-10-CM

## 2021-05-03 MED ORDER — LEVOTHYROXINE SODIUM 75 MCG PO TABS: ORAL_TABLET | ORAL | 1 refills | Status: DC

## 2021-06-24 ENCOUNTER — Other Ambulatory Visit: Payer: Self-pay | Admitting: Family Medicine

## 2021-06-24 DIAGNOSIS — E785 Hyperlipidemia, unspecified: Secondary | ICD-10-CM

## 2021-06-26 ENCOUNTER — Institutional Professional Consult (permissible substitution): Payer: BC Managed Care – PPO | Admitting: Neurology

## 2021-08-13 ENCOUNTER — Ambulatory Visit: Payer: BC Managed Care – PPO | Admitting: Neurology

## 2021-08-13 ENCOUNTER — Encounter: Payer: Self-pay | Admitting: Neurology

## 2021-08-13 VITALS — BP 121/79 | HR 65 | Ht 69.0 in | Wt 231.0 lb

## 2021-08-13 DIAGNOSIS — Z820 Family history of epilepsy and other diseases of the nervous system: Secondary | ICD-10-CM

## 2021-08-13 DIAGNOSIS — R0683 Snoring: Secondary | ICD-10-CM | POA: Diagnosis not present

## 2021-08-13 DIAGNOSIS — G4719 Other hypersomnia: Secondary | ICD-10-CM | POA: Diagnosis not present

## 2021-08-13 DIAGNOSIS — R0681 Apnea, not elsewhere classified: Secondary | ICD-10-CM

## 2021-08-13 DIAGNOSIS — Z9189 Other specified personal risk factors, not elsewhere classified: Secondary | ICD-10-CM

## 2021-08-13 DIAGNOSIS — E669 Obesity, unspecified: Secondary | ICD-10-CM

## 2021-08-13 DIAGNOSIS — R0689 Other abnormalities of breathing: Secondary | ICD-10-CM

## 2021-08-13 NOTE — Patient Instructions (Signed)

## 2021-08-13 NOTE — Progress Notes (Signed)
Subjective:    Patient ID: Brandon Williams is a 51 y.o. male.  HPI    Star Age, MD, PhD [facility] [address], [address] [place], [place] [address]  Dear Dr. Zigmund Daniel,  I saw your patient, Steele Stracener, similar sleep clinic today for initial consultation of his sleep disorder, in particular, concern for underlying obstructive sleep apnea.  The patient is unaccompanied today.  As you know, Mr. Courville is a 51 year old right-handed gentleman with an underlying medical history of allergic rhinitis, hyperlipidemia, thyroid disease, neck pain, elbow pain, and obesity, who reports snoring and excessive daytime somnolence, as well as witnessed apneas per wife's report.  He has even woken up with a sense of gasping for air.  His mom had sleep apnea and passed away young at the age of 71 from complications of her MS.  He suspects that his father has sleep apnea.  I reviewed your office note from 02/12/2021.  His Epworth sleepiness score is 15 out of 24, fatigue severity score is 50 out of 63. Patient lives with his wife, they have 1 son age 38.  Patient has a TV in the bedroom but does not watch it at night.  They have 2 dogs in the household, 1 sleep next to them and 1 sleeps with them.  Bedtime is generally around 10 PM and rise time around 6:45 AM.  He travels quite a bit for his work.  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  He is a non-smoker, he drinks caffeine in the form of coffee, 1 cup/day and occasional tea or soda, not daily.  He drinks alcohol occasionally on the weekend.  His weight has been more or less stable.  He has never had a tonsillectomy.  He denies any symptoms of restless leg syndrome.  His Past Medical History Is Significant For: Past Medical History:  Diagnosis Date   Allergic rhinitis    Allergy    Family history of malignant hyperthermia    paternal grandmother  and paternal uncle died in OR from [place]   Hyperlipidemia    Thyroid disease     His Past Surgical History Is Significant For: Past Surgical History:  Procedure Laterality Date   BACK SURGERY     thumb surgery      His Family History Is Significant For: Family History  Problem Relation Age of Onset   Multiple sclerosis Mother    Obstructive Sleep Apnea Mother    Heart disease Father    Heart attack Father    Thyroid disease Sister    Heart disease Paternal Grandfather    Colon cancer Neg Hx    Esophageal cancer Neg Hx    Stomach cancer Neg Hx    Rectal cancer Neg Hx     His Social History Is Significant For: Social History   Socioeconomic History   Marital status: Married    Spouse name: Not on file   Number of children: Not on file   Years of education: Not on file   Highest education level: Not on file  Occupational History   Not on file  Tobacco Use   Smoking status: Never   Smokeless tobacco: Never  Vaping Use   Vaping Use: Never used  Substance and Sexual Activity   Alcohol use: Yes    Alcohol/week: 1.0 - 2.0 standard drink    Types: 1 - 2 Standard drinks or equivalent  per week   Drug use: Never   Sexual activity: Yes  Other Topics Concern   Not on file  Social History Narrative   Caffiene 1 cup coffee in am,  soda /tea. Education: HS some college.  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    Social Determinants of Health   Financial Resource Strain: Not on file  Food Insecurity: Not on file  Transportation Needs: Not on file  Physical Activity: Not on file  Stress: Not on file  Social Connections: Not on file    His Allergies Are:  No Known Allergies:   His Current Medications Are:  Outpatient Encounter Medications as of 08/13/2021  Medication Sig   atorvastatin (LIPITOR) 10 MG tablet TAKE 1 TABLET BY MOUTH AT BEDTIME   glucosamine-chondroitin 500-400 MG tablet Take 1 tablet by mouth daily.   KRILL OIL PO Take 500 mg by mouth daily.    levothyroxine (SYNTHROID) 75 MCG tablet TAKE 1 TABLET BY MOUTH BEFORE BREAKFAST   Multiple Vitamin (MULTI VITAMIN MENS PO) Take 1 tablet by mouth daily.   [DISCONTINUED] predniSONE (DELTASONE) 50 MG tablet Take 1 tab po daily x5 days   No facility-administered encounter medications on file as of 08/13/2021.  :   Review of Systems:  Out of a complete 14 point review of systems, all are reviewed and negative with the exception of these symptoms as listed below:   Review of Systems  Neurological:        Fatigue, snoring, Napping on weekends , some witness apnea. ESS 15, FSS 50.   Objective:  Neurological Exam  Physical Exam Physical Examination:   Vitals:   08/13/21 0904  BP: 121/79  Pulse: 65    General Examination: The patient is a very pleasant 51 y.o. male in no acute distress. He appears well-developed and well-nourished and well groomed.   HEENT: Normocephalic, atraumatic, pupils are equal, round and reactive to light, extraocular tracking is good without limitation to gaze excursion or nystagmus noted. Hearing is grossly intact. Face is symmetric with normal facial animation. Speech is clear with no dysarthria noted. There is no hypophonia. There is no lip, neck/head, jaw or voice tremor. Neck is supple with full range of passive and active motion. There are no carotid bruits on auscultation. Oropharynx exam reveals: mild mouth dryness, good dental hygiene and mild airway crowding, due to small airway entry, tonsillar size of about 1+. Mallampati is class III. Tongue protrudes centrally and palate elevates symmetrically.  He does not have much in the way of overbite, slightly skewed teeth alignment.    Chest: Clear to auscultation without wheezing, rhonchi or crackles noted.  Heart: S1+S2+0, regular and normal without murmurs, rubs or gallops noted.   Abdomen: Soft, non-tender and non-distended.  Extremities: There is no pitting edema in the distal lower extremities  bilaterally.   Skin: Warm and dry without trophic changes noted.   Musculoskeletal: exam reveals no obvious joint deformities, tenderness or joint swelling or erythema.   Neurologically:  Mental status: The patient is awake, alert and oriented in all 4 spheres. His immediate and remote memory, attention, language skills and fund of knowledge are appropriate. There is no evidence of aphasia, agnosia, apraxia or anomia. Speech is clear with normal prosody and enunciation. Thought process is linear. Mood is normal and affect is normal.  Cranial nerves II - XII are as described above under HEENT exam.  Motor exam: Normal bulk, strength and tone is noted. There is no tremor, Romberg is  negative. Reflexes are 2+ throughout. Fine motor skills and coordination: grossly intact.  Cerebellar testing: No dysmetria or intention tremor. There is no truncal or gait ataxia.  Sensory exam: intact to light touch in the upper and lower extremities.  Gait, station and balance: he stands easily. No veering to one side is noted. No leaning to one side is noted. Posture is age-appropriate and stance is narrow based. Gait shows normal stride length and normal pace. No problems turning are noted.   Assessment and Plan:  In summary, MOHID FURUYA is a very pleasant 51 y.o.-year old male with an underlying medical history of allergic rhinitis, hyperlipidemia, thyroid disease, neck pain, elbow pain, and obesity, whose history and physical exam are concerning for obstructive sleep apnea (OSA). I had a long chat with the patient about my findings and the diagnosis of OSA, its prognosis and treatment options. We talked about medical treatments, surgical interventions and non-pharmacological approaches. I explained in particular the risks and ramifications of untreated moderate to severe OSA, especially with respect to developing cardiovascular disease down the Road, including congestive heart failure, difficult to treat  hypertension, cardiac arrhythmias, or stroke. Even type 2 diabetes has, in part, been linked to untreated OSA. Symptoms of untreated OSA include daytime sleepiness, memory problems, mood irritability and mood disorder such as depression and anxiety, lack of energy, as well as recurrent headaches, especially morning headaches. We talked about trying to maintain a healthy lifestyle in general, as well as the importance of weight control. We also talked about the importance of good sleep hygiene. I recommended the following at this time: sleep study.  I outlined the differences between a laboratory attended sleep study versus home sleep test.  I explained the sleep test procedure to the patient and also outlined possible surgical and non-surgical treatment options of OSA, including the use of a custom-made dental device (which would require a referral to a specialist dentist or oral surgeon), upper airway surgical options, such as traditional UPPP or a novel less invasive surgical option in the form of Inspire hypoglossal nerve stimulation (which would involve a referral to an ENT surgeon). I also explained the CPAP treatment option to the patient, who indicated that he would be willing to try CPAP if the need arises. I explained the importance of being compliant with PAP treatment, not only for insurance purposes but primarily to improve His symptoms, and for the patient's long term health benefit, including to reduce His cardiovascular risks. I answered all his questions today and the patient was in agreement. I plan to see him back after the sleep study is completed and encouraged him to call with any interim questions, concerns, problems or updates.   Thank you very much for allowing me to participate in the care of this nice patient. If I can be of any further assistance to you please do not hesitate to call me at [phone].  Sincerely,   Star Age, MD, PhD

## 2021-08-20 ENCOUNTER — Telehealth: Payer: Self-pay

## 2021-08-20 NOTE — Telephone Encounter (Signed)
LVM for pt to call me back to schedule sleep study  

## 2021-08-27 ENCOUNTER — Ambulatory Visit (INDEPENDENT_AMBULATORY_CARE_PROVIDER_SITE_OTHER): Payer: BC Managed Care – PPO | Admitting: Neurology

## 2021-08-27 ENCOUNTER — Other Ambulatory Visit: Payer: Self-pay

## 2021-08-27 DIAGNOSIS — R0689 Other abnormalities of breathing: Secondary | ICD-10-CM

## 2021-08-27 DIAGNOSIS — G4719 Other hypersomnia: Secondary | ICD-10-CM

## 2021-08-27 DIAGNOSIS — E66811 Obesity, class 1: Secondary | ICD-10-CM

## 2021-08-27 DIAGNOSIS — G4720 Circadian rhythm sleep disorder, unspecified type: Secondary | ICD-10-CM

## 2021-08-27 DIAGNOSIS — G4733 Obstructive sleep apnea (adult) (pediatric): Secondary | ICD-10-CM

## 2021-08-27 DIAGNOSIS — R0681 Apnea, not elsewhere classified: Secondary | ICD-10-CM

## 2021-08-27 DIAGNOSIS — R0683 Snoring: Secondary | ICD-10-CM

## 2021-08-27 DIAGNOSIS — Z9189 Other specified personal risk factors, not elsewhere classified: Secondary | ICD-10-CM

## 2021-08-27 DIAGNOSIS — Z820 Family history of epilepsy and other diseases of the nervous system: Secondary | ICD-10-CM

## 2021-08-27 DIAGNOSIS — E669 Obesity, unspecified: Secondary | ICD-10-CM

## 2021-09-03 NOTE — Addendum Note (Signed)
Addended by: Star Age on: 09/03/2021 02:38 PM   Modules accepted: Orders

## 2021-09-03 NOTE — Procedures (Signed)
PATIENT'S NAME:  Brandon Williams, Brandon Williams DOB:      [birth year]      MR#:    [id number]     DATE OF RECORDING: 08/27/2021 REFERRING M.D.:  Luetta Nutting, DO Study Performed:   Baseline Polysomnogram HISTORY: 51 year old man with an underlying medical history of allergic rhinitis, hyperlipidemia, thyroid disease, neck pain, elbow pain, and obesity, who reports snoring and excessive daytime somnolence, as well as witnessed apneas per wife's report. The patient endorsed the Epworth Sleepiness Scale at 15 points. The patient's weight 231 pounds with a height of 69 (inches), resulting in a BMI of 34.3 kg/m2.   CURRENT MEDICATIONS: Lipitor, Krill Oil, Synthroid, Multi Vitamin, Glucosamine-Chondroitin   PROCEDURE:  This is a multichannel digital polysomnogram utilizing the Somnostar 11.2 system.  Electrodes and sensors were applied and monitored per AASM Specifications.   EEG, EOG, Chin and Limb EMG, were sampled at 200 Hz.  ECG, Snore and Nasal Pressure, Thermal Airflow, Respiratory Effort, CPAP Flow and Pressure, Oximetry was sampled at 50 Hz. Digital video and audio were recorded.      BASELINE STUDY  Lights Out was at 20:29 and Lights On at 05:03.  Total recording time (TRT) was 515 minutes, with a total sleep time (TST) of 419 minutes.  The patient's sleep latency was 73 minutes, which is delayed. REM latency was 137.5 minutes, which is mildly delayed. The sleep efficiency was 81.4%.     SLEEP ARCHITECTURE: WASO (Wake after sleep onset) was 23 minutes with mild sleep fragmentation noted. There were 18 minutes in Stage N1, 238 minutes Stage N2, 77 minutes Stage N3 and 86 minutes in Stage REM.  The percentage of Stage N1 was 4.3%, Stage N2 was 56.8%, Stage N3 was 18.4% and Stage R (REM sleep) was 20.5%, which is normal. The arousals were noted as: 42 were spontaneous, 0 were associated with PLMs, 7 were associated with respiratory events.  RESPIRATORY ANALYSIS: There were a total of 44 respiratory events:  0  obstructive apneas, 0 central apneas and 0 mixed apneas with a total of 0 apneas and an apnea index (AI) of 0 /hour. There were 44 hypopneas with a hypopnea index of 6.3 /hour. The patient also had 0 respiratory event related arousals (RERAs).      The total APNEA/HYPOPNEA INDEX (AHI) was 6.3/hour and the total RESPIRATORY DISTURBANCE INDEX was  6.3 /hour.  24 events occurred in REM sleep and 40 events in NREM. The REM AHI was  16.7 /hour, versus a non-REM AHI of 3.6. The patient spent 47 minutes of total sleep time in the supine position and 372 minutes in non-supine.. The supine AHI was 20.4 versus a non-supine AHI of 4.5.  OXYGEN SATURATION & C02:  The Wake baseline 02 saturation was 94%, with the lowest being 83%. Time spent below 89% saturation equaled 5 minutes.   PERIODIC LIMB MOVEMENTS: The patient had a total of 0 Periodic Limb Movements.  The Periodic Limb Movement (PLM) index was 0 and the PLM Arousal index was 0/hour. Audio and video analysis did not show any abnormal or unusual movements, behaviors, phonations or vocalizations. The patient took no bathroom breaks. Mild snoring was noted. The EKG was in keeping with normal sinus rhythm (NSR).  Post-study, the patient indicated that sleep was the same as usual.   IMPRESSION:  Obstructive Sleep Apnea (OSA) Dysfunctions associated with sleep stages or arousal from sleep  RECOMMENDATIONS:  This study demonstrates overall mild obstructive sleep apnea, moderate during REM sleep and in  the supine position, with a total AHI of 6.3/hour, REM AHI of 16.7/hour, supine AHI of 20.4/hour and O2 nadir of 83%. The near-absence of supine sleep may underestimate his AHI and O2 nadir. Given the patient's medical history and sleep related complaints, treatment with positive airway pressure is recommended; this can be achieved in the form of autoPAP. Alternatively, a full-night CPAP titration study would allow optimization of therapy if needed, down the  road. Other treatment options may include avoidance of supine sleep position along with weight loss, or an oral appliance (through dentistry or orthodontics).  Please note that untreated obstructive sleep apnea may carry additional perioperative morbidity. Patients with significant obstructive sleep apnea should receive perioperative PAP therapy and the surgeons and particularly the anesthesiologist should be informed of the diagnosis and the severity of the sleep disordered breathing. This study shows mild sleep fragmentation but overall fairly normal sleep stage percentages.  The patient should be cautioned not to drive, work at heights, or operate dangerous or heavy equipment when tired or sleepy. Review and reiteration of good sleep hygiene measures should be pursued with any patient. The patient will be seen in follow-up by Dr. Rexene Alberts at [facility] for discussion of the test results and further management strategies. The referring provider will be notified of the test results.  I certify that I have reviewed the entire raw data recording prior to the issuance of this report in accordance with the Standards of Accreditation of the American Academy of Sleep Medicine (AASM)    Star Age, MD, PhD Diplomat, American Board of Neurology and Sleep Medicine (Neurology and Sleep Medicine)

## 2021-09-05 ENCOUNTER — Telehealth: Payer: Self-pay | Admitting: *Deleted

## 2021-09-05 NOTE — Telephone Encounter (Signed)
Spoke with patient and discussed his sleep study results.  Patient is amenable to trying AutoPap.  He did not have a preference of DME company.  We discussed options, will use [facility].  His questions were answered.  He understands [facility] will call him and get him set up for an appointment to receive his machine, be fit with a mask, and the show 90s machine.  Patient aware of insurance compliance requirements which includes using the machine at least 4 hours at night and also being seen here in the office for an initial follow-up between 31 and 90 days.  We scheduled an appointment for Wednesday Nov 20, 2021 at 11:30 AM with Jinny Blossom NP.  Patient aware to arrive 15 to 30 minutes early with his machine and power cord.  He verbalized appreciation for the call. ? ?Order faxed to [facility]. Received a receipt of confirmation. Letter sent to pt via mychart (his preference). Results forwarded to Dr Zigmund Daniel.  ?

## 2021-09-05 NOTE — Telephone Encounter (Signed)
-----   Message from Star Age, MD sent at 09/03/2021  2:37 PM EST ----- ?Patient referred by Dr. Zigmund Daniel, seen by me on 08/13/21, diagnostic PSG on 08/27/21.   ? ?Please call and notify the patient that the recent sleep study did confirm the diagnosis of obstructive sleep apnea. OSA is overall mild to moderate and worth treating to see if he feels better after treatment. To that end I recommend treatment for this in the form of autoPAP, which means, that we don't have to bring him back for a second sleep study with CPAP, but will let him try an autoPAP machine at home, through a DME company (of his choice, or as per insurance requirement). The DME representative will educate him on how to use the machine, how to put the mask on, etc. I have placed an order in the chart. Please send referral, talk to patient, send report to referring MD. We will need a FU in sleep clinic for 10 weeks post-PAP set up, please arrange that with me or one of our NPs. Thanks,  ? ?Star Age, MD, PhD ?[facility]) ? ? ? ?

## 2021-09-23 NOTE — Telephone Encounter (Signed)
DME: [facility] ?Ph: [phone] ?Fax: [phone] ?Resmed Airsense 11 Auto ?Setup 09/16/21 (appt needed 10/18/21 - 12/17/21) ?

## 2021-10-13 ENCOUNTER — Other Ambulatory Visit: Payer: Self-pay | Admitting: Family Medicine

## 2021-10-13 DIAGNOSIS — E785 Hyperlipidemia, unspecified: Secondary | ICD-10-CM

## 2021-11-10 ENCOUNTER — Other Ambulatory Visit: Payer: Self-pay | Admitting: Family Medicine

## 2021-11-10 DIAGNOSIS — E039 Hypothyroidism, unspecified: Secondary | ICD-10-CM

## 2021-11-12 NOTE — Telephone Encounter (Signed)
Pls contact the pt to schedule appt. Last appt 8/22. ?Thanks ?

## 2021-11-12 NOTE — Telephone Encounter (Signed)
Patient has been scheduled for 12/10/21. AMUCK ?

## 2021-11-18 ENCOUNTER — Telehealth: Payer: Self-pay | Admitting: Neurology

## 2021-11-18 NOTE — Telephone Encounter (Signed)
Pt has now been scheduled with Janett Billow NP for Tuesday June 6th at 7:45 AM. ?

## 2021-11-18 NOTE — Telephone Encounter (Signed)
I see Brandon Billow NP has a couple openings sooner, for instance June 6th. I called the pt and LVM with sooner appt options June 6th 7:45 AM or 3:45 PM and asked for a call back asap.  ?

## 2021-11-18 NOTE — Telephone Encounter (Signed)
Pt cx his initial 05-17 appointment due to work schedule f/u.  Pt aware he is to seen by 6-13 but he took next available and is on wait list ?

## 2021-11-20 ENCOUNTER — Ambulatory Visit: Payer: BC Managed Care – PPO | Admitting: Adult Health

## 2021-12-07 ENCOUNTER — Encounter: Payer: Self-pay | Admitting: Adult Health

## 2021-12-09 NOTE — Progress Notes (Unsigned)
[facility] [address] [place]. [place] [address] [phone]       OFFICE FOLLOW UP NOTE  Brandon Williams Date of Birth:  August 17, 1970 Medical Record Number:  [id number]   Reason for visit: Initial CPAP follow-up    SUBJECTIVE:   CHIEF COMPLAINT:  No chief complaint on file.   HPI:   Update 12/09/2021 JM: Patient returns for initial CPAP compliance visit.  Completed in lab sleep study 08/27/2021 which showed overall mild OSA although moderate during REM sleep and in supine position, with a  total AHI of 6.3/hour, REM AHI of 16.7/hour, supine AHI of  20.4/hour and O2 nadir of 83%.  The near-absence of supine sleep may underestimate AHI and O2 nadir. Given the patient's medical history and sleep related complaints, treatment with positive airway pressure recommended.  CPAP initiated on 09/16/2021.  Review of compliance report as noted below.          Consult visit 08/13/2021 Dr. Rexene Alberts: Brandon Williams is a 51 year old right-handed gentleman with an underlying medical history of allergic rhinitis, hyperlipidemia, thyroid disease, neck pain, elbow pain, and obesity, who reports snoring and excessive daytime somnolence, as well as witnessed apneas per wife's report.  He has even woken up with a sense of gasping for air.  His mom had sleep apnea and passed away young at the age of 40 from complications of her MS.  He suspects that his father has sleep apnea.  I reviewed your office note from 02/12/2021.  His Epworth sleepiness score is 15 out of 24, fatigue severity score is 50 out of 63. Patient lives with his wife, they have 1 son age 41.  Patient has a TV in the bedroom but does not watch it at night.  They have 2 dogs in the household, 1 sleep next to them and 1 sleeps with them.  Bedtime is generally around 10 PM and rise time around 6:45 AM.  He travels quite a bit for his work.  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  He is a non-smoker, he drinks caffeine in the form of coffee, 1 cup/day and occasional tea or soda, not daily.  He drinks alcohol occasionally on the weekend.  His weight has been more or less stable.  He has never had a tonsillectomy.  He denies any symptoms of restless leg syndrome.      ROS:   14 system review of systems performed and negative with exception of ***  PMH:  Past Medical History:  Diagnosis Date   Allergic rhinitis    Allergy    Family history of malignant hyperthermia    paternal grandmother and paternal uncle died in OR from [place]   Hyperlipidemia    Thyroid disease     PSH:  Past Surgical History:  Procedure Laterality Date   BACK SURGERY     thumb surgery      Social History:  Social History   Socioeconomic History   Marital status: Married    Spouse name: Not on file   Number of children: Not on file   Years of education: Not on file   Highest education level: Not on file  Occupational History   Not on file  Tobacco Use   Smoking status: Never   Smokeless tobacco: Never  Vaping Use   Vaping Use: Never used  Substance and Sexual Activity   Alcohol use: Yes    Alcohol/week:  1.0 - 2.0 standard drink    Types: 1 - 2 Standard drinks or equivalent per week   Drug use: Never   Sexual activity: Yes  Other Topics Concern   Not on file  Social History Narrative   Caffiene 1 cup coffee in am,  soda /tea. Education: HS some college.  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    Social Determinants of Radio broadcast assistant Strain: Not on file  Food Insecurity: Not on file  Transportation Needs: Not on file  Physical Activity: Not on file  Stress: Not on file  Social Connections: Not on file  Intimate Partner Violence: Not on file    Family History:  Family History  Problem Relation Age of Onset   Multiple sclerosis Mother    Obstructive Sleep Apnea Mother    Heart disease Father    Heart attack Father     Thyroid disease Sister    Heart disease Paternal Grandfather    Colon cancer Neg Hx    Esophageal cancer Neg Hx    Stomach cancer Neg Hx    Rectal cancer Neg Hx     Medications:   Current Outpatient Medications on File Prior to Visit  Medication Sig Dispense Refill   atorvastatin (LIPITOR) 10 MG tablet TAKE 1 TABLET BY MOUTH AT BEDTIME 90 tablet 0   glucosamine-chondroitin 500-400 MG tablet Take 1 tablet by mouth daily.     KRILL OIL PO Take 500 mg by mouth daily.     levothyroxine (SYNTHROID) 75 MCG tablet TAKE 1 TABLET BY MOUTH BEFORE BREAKFAST 90 tablet 0   Multiple Vitamin (MULTI VITAMIN MENS PO) Take 1 tablet by mouth daily.     No current facility-administered medications on file prior to visit.    Allergies:  No Known Allergies    OBJECTIVE:  Physical Exam  There were no vitals filed for this visit. There is no height or weight on file to calculate BMI. No results found.  General: well developed, well nourished, seated, in no evident distress Head: head normocephalic and atraumatic.   Neck: supple with no carotid or supraclavicular bruits Cardiovascular: regular rate and rhythm, no murmurs Musculoskeletal: no deformity Skin:  no rash/petichiae Vascular:  Normal pulses all extremities   Neurologic Exam Mental Status: Awake and fully alert. Oriented to place and time. Recent and remote memory intact. Attention span, concentration and fund of knowledge appropriate. Mood and affect appropriate.  Cranial Nerves: Pupils equal, briskly reactive to light. Extraocular movements full without nystagmus. Visual fields full to confrontation. Hearing intact. Facial sensation intact. Face, tongue, palate moves normally and symmetrically.  Motor: Normal bulk and tone. Normal strength in all tested extremity muscles Sensory.: intact to touch , pinprick , position and vibratory sensation.  Coordination: Rapid alternating movements normal in all extremities. Finger-to-nose and  heel-to-shin performed accurately bilaterally. Gait and Station: Arises from chair without difficulty. Stance is normal. Gait demonstrates normal stride length and balance without use of AD. Tandem walk and heel toe without difficulty.  Reflexes: 1+ and symmetric. Toes downgoing.         ASSESSMENT: Brandon Williams is a 51 y.o. year old male with diagnosis of mild to moderate OSA after completion of in lab sleep study in 08/2021 and initiation of CPAP 09/16/2021.      PLAN:  OSA on CPAP : Continue current AutoPap settings.  Compliance report shows adequate usage and optimal residual AHI.  Discussed importance of continue nightly usage and ensuring greater  than 4 hours nightly for optimal benefit and for insurance purposes.  He will continue to follow with DME company for any needed supplies or CPAP related concerns.    Follow up in *** or call earlier if needed   CC:  PCP: Luetta Nutting, DO    I spent *** minutes of face-to-face and non-face-to-face time with patient.  This included previsit chart review, lab review, study review, order entry, electronic health record documentation, patient education regarding diagnosis of sleep apnea with review and discussion of compliance report and answered all other questions to patient's satisfaction   Frann Rider, [facility]  [facility] [address] [place], [place] [address]  Phone [phone] Fax [phone] Note: This document was prepared with digital dictation and possible smart phrase technology. Any transcriptional errors that result from this process are unintentional.

## 2021-12-10 ENCOUNTER — Telehealth: Payer: Self-pay

## 2021-12-10 ENCOUNTER — Ambulatory Visit: Payer: BC Managed Care – PPO | Admitting: Family Medicine

## 2021-12-10 ENCOUNTER — Encounter: Payer: Self-pay | Admitting: Neurology

## 2021-12-10 ENCOUNTER — Encounter: Payer: Self-pay | Admitting: Adult Health

## 2021-12-10 ENCOUNTER — Ambulatory Visit: Payer: BC Managed Care – PPO | Admitting: Adult Health

## 2021-12-10 VITALS — BP 124/85 | HR 58 | Ht 69.0 in | Wt 230.0 lb

## 2021-12-10 DIAGNOSIS — Z9989 Dependence on other enabling machines and devices: Secondary | ICD-10-CM | POA: Diagnosis not present

## 2021-12-10 DIAGNOSIS — G4733 Obstructive sleep apnea (adult) (pediatric): Secondary | ICD-10-CM | POA: Diagnosis not present

## 2021-12-10 NOTE — Telephone Encounter (Signed)
Order for updated CPAP settings has been sent to [facility].  Updated settings are adjust AutoPap setting via res med from 5-11 with EPR level of 1 to 5-10 with EPR level of 3.   Confirmation of fax order received.

## 2021-12-10 NOTE — Patient Instructions (Signed)
We will make adjustments to your CPAP pressure settings -if you continue to have difficulty with leaks over the next couple of weeks, please further discuss with your DME company [facility] as they may need to ensure your fullface mask is fitting properly  Please continue nightly use of your CPAP and ensure you are using greater than 4 hours nightly for optimal benefit and for insurance purposes but essentially we would like for you to be using your CPAP during the entirety of the night and with any daytime naps    Follow-up in 6 months or call earlier if needed

## 2021-12-30 ENCOUNTER — Other Ambulatory Visit: Payer: Self-pay

## 2021-12-30 ENCOUNTER — Ambulatory Visit (INDEPENDENT_AMBULATORY_CARE_PROVIDER_SITE_OTHER): Payer: BC Managed Care – PPO | Admitting: Family Medicine

## 2021-12-30 ENCOUNTER — Encounter: Payer: Self-pay | Admitting: Family Medicine

## 2021-12-30 VITALS — BP 138/89 | HR 71 | Ht 69.0 in | Wt 228.0 lb

## 2021-12-30 DIAGNOSIS — E039 Hypothyroidism, unspecified: Secondary | ICD-10-CM

## 2021-12-30 DIAGNOSIS — E785 Hyperlipidemia, unspecified: Secondary | ICD-10-CM

## 2021-12-30 DIAGNOSIS — Z0000 Encounter for general adult medical examination without abnormal findings: Secondary | ICD-10-CM

## 2021-12-30 DIAGNOSIS — Z125 Encounter for screening for malignant neoplasm of prostate: Secondary | ICD-10-CM | POA: Diagnosis not present

## 2021-12-30 NOTE — Progress Notes (Signed)
Ordered fasting labs.

## 2021-12-30 NOTE — Progress Notes (Signed)
Brandon Williams - 51 y.o. male MRN [id number]  Date of birth: 1970/08/09  Subjective No chief complaint on file.   HPI Brandon Williams is a 51 year old male here today for annual exam.  Reports overall he is doing well.  He had labs completed this morning.  Continues to stay fairly active.  Feels like diet is pretty good.  He is a non-smoker.  Consumes alcohol occasionally.  Declines Shingrix vaccine.  Denies history of chickenpox.  Has had titers in the past which were negative.  Review of Systems  Constitutional:  Negative for chills, fever, malaise/fatigue and weight loss.  HENT:  Negative for congestion, ear pain and sore throat.   Eyes:  Negative for blurred vision, double vision and pain.  Respiratory:  Negative for cough and shortness of breath.   Cardiovascular:  Negative for chest pain and palpitations.  Gastrointestinal:  Negative for abdominal pain, blood in stool, constipation, heartburn and nausea.  Genitourinary:  Negative for dysuria and urgency.  Musculoskeletal:  Negative for joint pain and myalgias.  Neurological:  Negative for dizziness and headaches.  Endo/Heme/Allergies:  Does not bruise/bleed easily.  Psychiatric/Behavioral:  Negative for depression. The patient is not nervous/anxious and does not have insomnia.     No Known Allergies  Past Medical History:  Diagnosis Date   Allergic rhinitis    Allergy    Family history of malignant hyperthermia    paternal grandmother and paternal uncle died in OR from [place]   Hyperlipidemia    Thyroid disease     Past Surgical History:  Procedure Laterality Date   BACK SURGERY     thumb surgery      Social History   Socioeconomic History   Marital status: Married    Spouse name: Not on file   Number of children: Not on file   Years of education: Not on file   Highest education level: Not on file  Occupational History   Not on file  Tobacco Use   Smoking status: Never   Smokeless tobacco: Never  Vaping Use    Vaping Use: Never used  Substance and Sexual Activity   Alcohol use: Yes    Alcohol/week: 1.0 - 2.0 standard drink of alcohol    Types: 1 - 2 Standard drinks or equivalent per week   Drug use: Never   Sexual activity: Yes  Other Topics Concern   Not on file  Social History Narrative   Caffiene 1 cup coffee in am,  soda /tea. Education: HS some college.  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    Social Determinants of Health   Financial Resource Strain: Not on file  Food Insecurity: Not on file  Transportation Needs: Not on file  Physical Activity: Not on file  Stress: Not on file  Social Connections: Not on file    Family History  Problem Relation Age of Onset   Multiple sclerosis Mother    Obstructive Sleep Apnea Mother    Heart disease Father    Heart attack Father    Thyroid disease Sister    Heart disease Paternal Grandfather    Colon cancer Neg Hx    Esophageal cancer Neg Hx    Stomach cancer Neg Hx    Rectal cancer Neg Hx     Health Maintenance  Topic Date Due   COVID-19 Vaccine (4 - Booster for Moderna series) 01/15/2022 (Originally 07/01/2020)   Zoster Vaccines- Shingrix (1 of 2) 04/01/2022 (Originally 03/05/1990)   HIV Screening  12/31/2022 (Originally  03/05/1986)   INFLUENZA VACCINE  02/04/2022   COLONOSCOPY (Pts 45-33yrs Insurance coverage will need to be confirmed)  03/08/2025   TETANUS/TDAP  01/22/2027   Hepatitis C Screening  Completed   HPV VACCINES  Aged Out     ----------------------------------------------------------------------------------------------------------------------------------------------------------------------------------------------------------------- Physical Exam BP 138/89 (BP Location: Left Arm, Patient Position: Sitting, Cuff Size: Large)   Pulse 71   Ht 5\' 9"  (1.753 m)   Wt 228 lb (103.4 kg)   SpO2 95%   BMI 33.67 kg/m   Physical Exam Constitutional:      General: [name]. HENT:     Head:  Normocephalic and atraumatic.     Right Ear: Tympanic membrane and external ear normal.     Left Ear: Tympanic membrane and external ear normal.  Eyes:     General: No scleral icterus. Neck:     Thyroid: No thyromegaly.  Cardiovascular:     Rate and Rhythm: Normal rate and regular rhythm.     Heart sounds: Normal heart sounds.  Pulmonary:     Effort: Pulmonary effort is normal.     Breath sounds: Normal breath sounds.  Abdominal:     General: Bowel sounds are normal. There is no distension.     Palpations: Abdomen is soft.     Tenderness: There is no abdominal tenderness. There is no guarding.  Musculoskeletal:     Cervical back: Normal range of motion.  Lymphadenopathy:     Cervical: No cervical adenopathy.  Skin:    General: Skin is warm and dry.     Findings: No rash.  Neurological:     Mental Status: [name], place, and time.     Cranial Nerves: No cranial nerve deficit.     Motor: No abnormal muscle tone.  Psychiatric:        Mood and Affect: Mood normal.        Behavior: Behavior normal.     ------------------------------------------------------------------------------------------------------------------------------------------------------------------------------------------------------------------- Assessment and Plan  Well adult exam Well adult Labs completed this morning, results pending Immunizations: Declines Shingrix Screenings: Up-to-date Anticipatory guidance Anticipatory guidance/risk factor reduction: Recommendations per AVS   No orders of the defined types were placed in this encounter.   No follow-ups on file.    This visit occurred during the SARS-CoV-2 public health emergency.  Safety protocols were in place, including screening questions prior to the visit, additional usage of staff PPE, and extensive cleaning of exam room while observing appropriate contact time as indicated for disinfecting solutions.

## 2021-12-31 LAB — CBC WITH DIFFERENTIAL/PLATELET
Absolute Monocytes: 490 cells/uL (ref 200–950)
Basophils Absolute: 20 cells/uL (ref 0–200)
Basophils Relative: 0.4 %
Eosinophils Absolute: 51 cells/uL (ref 15–500)
Eosinophils Relative: 1 %
HCT: 47.9 % (ref 38.5–50.0)
Hemoglobin: 16 g/dL (ref 13.2–17.1)
Lymphs Abs: 1642 cells/uL (ref 850–3900)
MCH: 31.8 pg (ref 27.0–33.0)
MCHC: 33.4 g/dL (ref 32.0–36.0)
MCV: 95.2 fL (ref 80.0–100.0)
MPV: 10.3 fL (ref 7.5–12.5)
Monocytes Relative: 9.6 %
Neutro Abs: 2897 cells/uL (ref 1500–7800)
Neutrophils Relative %: 56.8 %
Platelets: 193 10*3/uL (ref 140–400)
RBC: 5.03 10*6/uL (ref 4.20–5.80)
RDW: 12.2 % (ref 11.0–15.0)
Total Lymphocyte: 32.2 %
WBC: 5.1 10*3/uL (ref 3.8–10.8)

## 2021-12-31 LAB — LIPID PANEL W/REFLEX DIRECT LDL
Cholesterol: 162 mg/dL (ref <200)
HDL: 37 mg/dL — ABNORMAL LOW (ref >=40)
LDL Cholesterol (Calc): 88 mg/dL (calc)
Non-HDL Cholesterol (Calc): 125 mg/dL (calc) (ref <130)
Total CHOL/HDL Ratio: 4.4 (calc) (ref <5.0)
Triglycerides: 278 mg/dL — ABNORMAL HIGH (ref <150)

## 2021-12-31 LAB — COMPLETE METABOLIC PANEL WITH GFR
AG Ratio: 1.8 (calc) (ref 1.0–2.5)
ALT: 44 U/L (ref 9–46)
AST: 37 U/L — ABNORMAL HIGH (ref 10–35)
Albumin: 4.5 g/dL (ref 3.6–5.1)
Alkaline phosphatase (APISO): 57 U/L (ref 35–144)
BUN: 12 mg/dL (ref 7–25)
CO2: 27 mmol/L (ref 20–32)
Calcium: 9.3 mg/dL (ref 8.6–10.3)
Chloride: 104 mmol/L (ref 98–110)
Creat: 0.97 mg/dL (ref 0.70–1.30)
Globulin: 2.5 g/dL (calc) (ref 1.9–3.7)
Glucose, Bld: 97 mg/dL (ref 65–99)
Potassium: 4.4 mmol/L (ref 3.5–5.3)
Sodium: 138 mmol/L (ref 135–146)
Total Bilirubin: 0.7 mg/dL (ref 0.2–1.2)
Total Protein: 7 g/dL (ref 6.1–8.1)
eGFR: 95 mL/min/{1.73_m2} (ref >=60)

## 2021-12-31 LAB — TSH: TSH: 3.46 mIU/L (ref 0.40–4.50)

## 2022-01-06 ENCOUNTER — Other Ambulatory Visit: Payer: Self-pay | Admitting: Family Medicine

## 2022-01-06 DIAGNOSIS — E785 Hyperlipidemia, unspecified: Secondary | ICD-10-CM

## 2022-01-06 DIAGNOSIS — E039 Hypothyroidism, unspecified: Secondary | ICD-10-CM

## 2022-03-06 ENCOUNTER — Ambulatory Visit: Payer: BC Managed Care – PPO | Admitting: Family Medicine

## 2022-03-31 ENCOUNTER — Other Ambulatory Visit: Payer: Self-pay | Admitting: Family Medicine

## 2022-03-31 DIAGNOSIS — E785 Hyperlipidemia, unspecified: Secondary | ICD-10-CM

## 2022-04-29 ENCOUNTER — Other Ambulatory Visit: Payer: Self-pay | Admitting: Family Medicine

## 2022-04-29 DIAGNOSIS — E039 Hypothyroidism, unspecified: Secondary | ICD-10-CM

## 2022-05-28 NOTE — Progress Notes (Deleted)
[facility] [address] [place]. [place] [address] [phone]       OFFICE FOLLOW UP NOTE  Brandon Williams Date of Birth:  14-Apr-1971 Medical Record Number:  [id number]   Reason for visit: Initial CPAP follow-up    SUBJECTIVE:   CHIEF COMPLAINT:  No chief complaint on file.   HPI:   Update 06/02/2022 JM: Patient returns for 43-monthCPAP follow-up.  Review of DL report as below showing adequate compliance and residual AHI.         History provided for reference purposes only Update 12/09/2021 JM: Patient returns for initial CPAP compliance visit.  Completed in lab sleep study 08/27/2021 which showed overall mild OSA although moderate during REM sleep and in supine position, with Brandon total AHI of 6.3/hour, REM AHI of 16.7/hour, supine AHI of  20.4/hour and O2 nadir of 83%.  The near-absence of supine sleep may underestimate AHI and O2 nadir. Given the patient's medical history and sleep related complaints, treatment with positive airway pressure recommended.  CPAP initiated on 09/16/2021.  Review of compliance report as noted below.  His tolerance for CPAP has been gradually improving.  He initially was using nasal pillow but had issues with his sinuses feeling congested but this has since improved with use of fullface mask.  He was initially having difficulty wearing throughout the night but this is also been gradually improving.  He does mention Brandon rather continuous leak or high-pitched noise coming from around his mask, at times will improve after readjusting but not always.  Can also have occasional discomfort when exhaling.  He does continue to wake up occasionally throughout the night but has improved some since starting therapy.  Daytime fatigue has also improved some.  He no longer snores.  Epworth Sleepiness Scale 6/24 (prior to CPAP 15/24) Fatigue severity scale 22/63 (prior to CPAP 50/63)          Consult visit 08/13/2021 Dr. ARexene Williams Brandon.  Williams Brandon 51year old right-handed gentleman with an underlying medical history of allergic rhinitis, hyperlipidemia, thyroid disease, neck pain, elbow pain, and obesity, who reports snoring and excessive daytime somnolence, as well as witnessed apneas per wife's report.  He has even woken up with Brandon sense of gasping for air.  His mom had sleep apnea and passed away young at the age of 547from complications of her MS.  He suspects that his father has sleep apnea.  I reviewed your office note from 02/12/2021.  His Epworth sleepiness score is 15 out of 24, fatigue severity score is 50 out of 63. Patient lives with his wife, they have 1 son age 51  Patient has Brandon TV in the bedroom but does not watch it at night.  They have 2 dogs in the household, 1 sleep next to them and 1 sleeps with them.  Bedtime is generally around 10 PM and rise time around 6:45 AM.  He travels quite Brandon bit for his work.  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  He is Brandon non-smoker, he drinks caffeine in the form of coffee, 1 cup/day and occasional tea or soda, not daily.  He drinks alcohol occasionally on the weekend.  His weight has been more or less stable.  He has never had Brandon tonsillectomy.  He denies any symptoms of restless leg syndrome.      ROS:   14 system review of systems performed and negative with exception  of those listed in HPI  PMH:  Past Medical History:  Diagnosis Date   Allergic rhinitis    Allergy    Family history of malignant hyperthermia    paternal grandmother and paternal uncle died in OR from [place]   Hyperlipidemia    Thyroid disease     PSH:  Past Surgical History:  Procedure Laterality Date   BACK SURGERY     thumb surgery      Social History:  Social History   Socioeconomic History   Marital status: Married    Spouse name: Not on file   Number of children: Not on file   Years of education: Not on file   Highest  education level: Not on file  Occupational History   Not on file  Tobacco Use   Smoking status: Never   Smokeless tobacco: Never  Vaping Use   Vaping Use: Never used  Substance and Sexual Activity   Alcohol use: Yes    Alcohol/week: 1.0 - 2.0 standard drink of alcohol    Types: 1 - 2 Standard drinks or equivalent per week   Drug use: Never   Sexual activity: Yes  Other Topics Concern   Not on file  Social History Narrative   Caffiene 1 cup coffee in am,  soda /tea. Education: HS some college.  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    Social Determinants of Radio broadcast assistant Strain: Not on file  Food Insecurity: Not on file  Transportation Needs: Not on file  Physical Activity: Not on file  Stress: Not on file  Social Connections: Not on file  Intimate Partner Violence: Not on file    Family History:  Family History  Problem Relation Age of Onset   Multiple sclerosis Mother    Obstructive Sleep Apnea Mother    Heart disease Father    Heart attack Father    Thyroid disease Sister    Heart disease Paternal Grandfather    Colon cancer Neg Hx    Esophageal cancer Neg Hx    Stomach cancer Neg Hx    Rectal cancer Neg Hx     Medications:   Current Outpatient Medications on File Prior to Visit  Medication Sig Dispense Refill   atorvastatin (LIPITOR) 10 MG tablet TAKE 1 TABLET BY MOUTH AT BEDTIME 90 tablet 0   glucosamine-chondroitin 500-400 MG tablet Take 1 tablet by mouth daily.     KRILL OIL PO Take 500 mg by mouth daily.     levothyroxine (SYNTHROID) 75 MCG tablet TAKE 1 TABLET BY MOUTH BEFORE BREAKFAST 90 tablet 0   Multiple Vitamin (MULTI VITAMIN MENS PO) Take 1 tablet by mouth daily.     No current facility-administered medications on file prior to visit.    Allergies:  No Known Allergies    OBJECTIVE:  Physical Exam  There were no vitals filed for this visit.  There is no height or weight on file to calculate BMI. No results  found.  General: well developed, well nourished, very pleasant middle-age Caucasian male, seated, in no evident distress Head: head normocephalic and atraumatic.   Neck: supple with no carotid or supraclavicular bruits Cardiovascular: regular rate and rhythm, no murmurs Musculoskeletal: no deformity Skin:  no rash/petichiae Vascular:  Normal pulses all extremities   Neurologic Exam Mental Status: Awake and fully alert. Oriented to place and time. Recent and remote memory intact. Attention span, concentration and fund of knowledge appropriate. Mood and affect appropriate.  Cranial Nerves: Pupils  equal, briskly reactive to light. Extraocular movements full without nystagmus. Visual fields full to confrontation. Hearing intact. Facial sensation intact. Face, tongue, palate moves normally and symmetrically.  Motor: Normal bulk and tone. Normal strength in all tested extremity muscles Sensory.: intact to touch , pinprick , position and vibratory sensation.  Coordination: Rapid alternating movements normal in all extremities. Finger-to-nose and heel-to-shin performed accurately bilaterally. Gait and Station: Arises from chair without difficulty. Stance is normal. Gait demonstrates normal stride length and balance without use of AD. Tandem walk and heel toe without difficulty.  Reflexes: 1+ and symmetric. Toes downgoing.         ASSESSMENT: Brandon Williams is Brandon 51 y.o. year old male with diagnosis of mild to moderate OSA after completion of in lab sleep study in 08/2021 and initiation of CPAP 09/16/2021.      PLAN:  OSA on CPAP : Compliance report shows good compliance with optimal residual AHI.  Due to leaks and some discomfort, will adjust pressure from 5-11 with EPR level 1 to 5-10 with EPR 3. If he continues to have difficulty, he was encouraged to further discuss with his DME company.  Discussed importance of continue nightly usage and ensuring greater than 4 hours nightly for optimal  benefit and for insurance purposes.  He will continue to follow with DME company for any needed supplies or CPAP related concerns.    Follow up in 6 months or call earlier if needed   CC:  PCP: Luetta Nutting, DO    I spent 24 minutes of face-to-face and non-face-to-face time with patient.  This included previsit chart review, lab review, study review, order entry, electronic health record documentation, patient education regarding diagnosis of sleep apnea with review and discussion of compliance report and answered all other questions to patient's satisfaction   Frann Rider, [facility]  [facility] [address] [place], [place] [address]  Phone [phone] Fax [phone] Note: This document was prepared with digital dictation and possible smart phrase technology. Any transcriptional errors that result from this process are unintentional.

## 2022-06-02 ENCOUNTER — Ambulatory Visit: Payer: BC Managed Care – PPO | Admitting: Adult Health

## 2022-06-02 ENCOUNTER — Telehealth: Payer: Self-pay | Admitting: Adult Health

## 2022-06-02 NOTE — Telephone Encounter (Signed)
Message to cancel appt

## 2022-06-04 ENCOUNTER — Encounter: Payer: Self-pay | Admitting: Adult Health

## 2022-06-04 ENCOUNTER — Ambulatory Visit: Payer: BC Managed Care – PPO | Admitting: Adult Health

## 2022-06-04 VITALS — BP 140/91 | HR 68 | Ht 69.0 in | Wt 232.0 lb

## 2022-06-04 DIAGNOSIS — G4733 Obstructive sleep apnea (adult) (pediatric): Secondary | ICD-10-CM | POA: Diagnosis not present

## 2022-06-04 NOTE — Progress Notes (Signed)
New orders were placed for the patient for supplies, order faxed to [facility]. Received confirmation that it went through.

## 2022-06-04 NOTE — Patient Instructions (Signed)
No changes today -continue nightly use of CPAP ensuring greater than 4 hours per night for optimal benefit and per insurance requirements  Continue to follow with your DME company [facility] for any needed supplies or CPAP related concerns    Follow-up in 1 year or call earlier if needed

## 2022-06-04 NOTE — Progress Notes (Signed)
[facility] [address] [place]. [place] [address] [phone]       OFFICE FOLLOW UP NOTE  [name] Date of Birth:  06-Apr-1971 Medical Record Number:  [id number]   Reason for visit: CPAP follow-up    SUBJECTIVE:   CHIEF COMPLAINT:  Chief Complaint  Patient presents with   Follow-up    Pt aone, rm 2 here for cpap follow up. DME [facility]. Overall stable and states no concerns    HPI:   Update 06/02/2022 JM: Patient returns for 56-monthCPAP follow-up.  Review of DL report as below showing adequate compliance and residual AHI.  Reports tolerating CPAP better since adjustment to pressure settings and EPR level.  He does continue to have occasional whistling noise coming from tube/mask which can be bothersome at times. Has not yet further discussed with DME, plans on contacting them today to discuss.  Epworth Sleepiness Scale 10/24.         History provided for reference purposes only Update 12/09/2021 JM: Patient returns for initial CPAP compliance visit.  Completed in lab sleep study 08/27/2021 which showed overall mild OSA although moderate during REM sleep and in supine position, with a total AHI of 6.3/hour, REM AHI of 16.7/hour, supine AHI of  20.4/hour and O2 nadir of 83%.  The near-absence of supine sleep may underestimate AHI and O2 nadir. Given the patient's medical history and sleep related complaints, treatment with positive airway pressure recommended.  CPAP initiated on 09/16/2021.  Review of compliance report as noted below.  His tolerance for CPAP has been gradually improving.  He initially was using nasal pillow but had issues with his sinuses feeling congested but this has since improved with use of fullface mask.  He was initially having difficulty wearing throughout the night but this is also been gradually improving.  He does mention a rather continuous leak or high-pitched noise coming from around his mask, at times will improve  after readjusting but not always.  Can also have occasional discomfort when exhaling.  He does continue to wake up occasionally throughout the night but has improved some since starting therapy.  Daytime fatigue has also improved some.  He no longer snores.  Epworth Sleepiness Scale 6/24 (prior to CPAP 15/24) Fatigue severity scale 22/63 (prior to CPAP 50/63)          Consult visit 08/13/2021 Dr. ARexene Alberts [name] a 51year old right-handed gentleman with an underlying medical history of allergic rhinitis, hyperlipidemia, thyroid disease, neck pain, elbow pain, and obesity, who reports snoring and excessive daytime somnolence, as well as witnessed apneas per wife's report.  He has even woken up with a sense of gasping for air.  His mom had sleep apnea and passed away young at the age of 514from complications of her MS.  He suspects that his father has sleep apnea.  I reviewed your office note from 02/12/2021.  His Epworth sleepiness score is 15 out of 24, fatigue severity score is 50 out of 63. Patient lives with his wife, they have 1 son age 51  Patient has a TV in the bedroom but does not watch it at night.  They have 2 dogs in the household, 1 sleep next to them and 1 sleeps with them.  Bedtime is generally around 10 PM and rise time around 6:45 AM.  He travels quite a bit for his work.  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  He is a non-smoker, he drinks caffeine in the form of coffee, 1 cup/day and occasional tea or soda, not daily.  He drinks alcohol occasionally on the weekend.  His weight has been more or less stable.  He has never had a tonsillectomy.  He denies any symptoms of restless leg syndrome.      ROS:   14 system review of systems performed and negative with exception of those listed in HPI  PMH:  Past Medical History:  Diagnosis Date   Allergic rhinitis    Allergy    Family history of malignant  hyperthermia    paternal grandmother and paternal uncle died in OR from [place]   Hyperlipidemia    Thyroid disease     PSH:  Past Surgical History:  Procedure Laterality Date   BACK SURGERY     thumb surgery      Social History:  Social History   Socioeconomic History   Marital status: Married    Spouse name: Not on file   Number of children: Not on file   Years of education: Not on file   Highest education level: Not on file  Occupational History   Not on file  Tobacco Use   Smoking status: Never   Smokeless tobacco: Never  Vaping Use   Vaping Use: Never used  Substance and Sexual Activity   Alcohol use: Yes    Alcohol/week: 1.0 - 2.0 standard drink of alcohol    Types: 1 - 2 Standard drinks or equivalent per week   Drug use: Never   Sexual activity: Yes  Other Topics Concern   Not on file  Social History Narrative   Caffiene 1 cup coffee in am,  soda /tea. Education: HS some college.  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    Social Determinants of Radio broadcast assistant Strain: Not on file  Food Insecurity: Not on file  Transportation Needs: Not on file  Physical Activity: Not on file  Stress: Not on file  Social Connections: Not on file  Intimate Partner Violence: Not on file    Family History:  Family History  Problem Relation Age of Onset   Multiple sclerosis Mother    Obstructive Sleep Apnea Mother    Heart disease Father    Heart attack Father    Thyroid disease Sister    Heart disease Paternal Grandfather    Colon cancer Neg Hx    Esophageal cancer Neg Hx    Stomach cancer Neg Hx    Rectal cancer Neg Hx     Medications:   Current Outpatient Medications on File Prior to Visit  Medication Sig Dispense Refill   atorvastatin (LIPITOR) 10 MG tablet TAKE 1 TABLET BY MOUTH AT BEDTIME 90 tablet 0   glucosamine-chondroitin 500-400 MG tablet Take 1 tablet by mouth daily.     KRILL OIL PO Take 500 mg by mouth daily.     levothyroxine  (SYNTHROID) 75 MCG tablet TAKE 1 TABLET BY MOUTH BEFORE BREAKFAST 90 tablet 0   Multiple Vitamin (MULTI VITAMIN MENS PO) Take 1 tablet by mouth daily.     No current facility-administered medications on file prior to visit.    Allergies:  No Known Allergies    OBJECTIVE:  Physical Exam  Vitals:   06/04/22 0923  BP: (!) 140/91  Pulse: 68  Weight: 232 lb (105.2 kg)  Height: '5\' 9"'$  (1.753 m)   Body mass index is 34.26 kg/m. No results found.  General: well developed, well nourished, very pleasant middle-age Caucasian male, seated, in no evident distress Head: head normocephalic and atraumatic.   Neck: supple with no carotid or supraclavicular bruits Cardiovascular: regular rate and rhythm, no murmurs Musculoskeletal: no deformity Skin:  no rash/petichiae Vascular:  Normal pulses all extremities   Neurologic Exam Mental Status: Awake and fully alert. Oriented to place and time. Recent and remote memory intact. Attention span, concentration and fund of knowledge appropriate. Mood and affect appropriate.  Cranial Nerves: Pupils equal, briskly reactive to light. Extraocular movements full without nystagmus. Visual fields full to confrontation. Hearing intact. Facial sensation intact. Face, tongue, palate moves normally and symmetrically.  Motor: Normal bulk and tone. Normal strength in all tested extremity muscles Sensory.: intact to touch , pinprick , position and vibratory sensation.  Coordination: Rapid alternating movements normal in all extremities. Finger-to-nose and heel-to-shin performed accurately bilaterally. Gait and Station: Arises from chair without difficulty. Stance is normal. Gait demonstrates normal stride length and balance without use of AD Reflexes: 1+ and symmetric. Toes downgoing.         ASSESSMENT: SHAWAN CORELLA is a 52 y.o. year old male with diagnosis of mild to moderate OSA after completion of in lab sleep study in 08/2021 and initiation of CPAP  09/16/2021.      PLAN:  OSA on CPAP : Compliance report shows good compliance with optimal residual AHI.  Continue current pressure settings. F/u with DME [facility] to discuss high pitched whistling noise to ensure no issues with connections. Discussed importance of continue nightly usage and ensuring greater than 4 hours nightly for optimal benefit and for insurance purposes.  He will continue to follow with DME company for any needed supplies or CPAP related concerns.    Follow up in 1 year or call earlier if needed   CC:  PCP: Luetta Nutting, DO    I spent 21 minutes of face-to-face and non-face-to-face time with patient.  This included previsit chart review, lab review, study review, order entry, electronic health record documentation, patient education regarding sleep apnea with review and discussion of compliance report and answered all other questions to patient's satisfaction   Frann Rider, [facility]  [facility] [address] [place], [place] [address]  Phone [phone] Fax [phone] Note: This document was prepared with digital dictation and possible smart phrase technology. Any transcriptional errors that result from this process are unintentional.

## 2022-06-20 ENCOUNTER — Ambulatory Visit: Payer: BC Managed Care – PPO | Admitting: Family Medicine

## 2022-06-20 ENCOUNTER — Encounter: Payer: Self-pay | Admitting: Family Medicine

## 2022-06-20 VITALS — BP 133/90 | HR 69 | Ht 69.0 in | Wt 237.0 lb

## 2022-06-20 DIAGNOSIS — H8111 Benign paroxysmal vertigo, right ear: Secondary | ICD-10-CM | POA: Diagnosis not present

## 2022-06-20 DIAGNOSIS — I10 Essential (primary) hypertension: Secondary | ICD-10-CM | POA: Diagnosis not present

## 2022-06-20 DIAGNOSIS — E785 Hyperlipidemia, unspecified: Secondary | ICD-10-CM | POA: Diagnosis not present

## 2022-06-20 MED ORDER — OLMESARTAN MEDOXOMIL 5 MG PO TABS
5.0000 mg | ORAL_TABLET | Freq: Every day | ORAL | 0 refills | Status: DC
Start: 2022-06-20 — End: 2022-09-12

## 2022-06-20 MED ORDER — ATORVASTATIN CALCIUM 20 MG PO TABS: 20.0000 mg | ORAL_TABLET | Freq: Every day | ORAL | 3 refills | Status: DC

## 2022-06-20 NOTE — Assessment & Plan Note (Signed)
Discussed BPs have been high recently. Discussed DASH diet, weight reduction, regular exercise. Start ARB. F/U 3 weeks nurse visit and che3ck BMP at that time.

## 2022-06-20 NOTE — Assessment & Plan Note (Signed)
Will inc statin to get LDL less than 70. He is very concerned about fam hx of heart disease in father.  We discussed option on Cardiac Calcium Scoring.  ( His wife is having this done).

## 2022-06-20 NOTE — Patient Instructions (Signed)
Will increase your lipitor to '20mg'$ .  Start new BP pill daily in AM.   Nurse visit in 3 weeks to recheck BP and check blood work for [employer]

## 2022-06-20 NOTE — Progress Notes (Signed)
Acute Office Visit  Subjective:     Patient ID: Brandon Williams, male    DOB: [birth year], 51 y.o.   MRN: [id number]  Chief Complaint  Patient presents with   Dizziness    Pt reports that sxs began on Tuesday. He feels it mostly whenever he turns to the right or bends down    HPI Patient is in today for dizziness that started 3 days ago.   Notice it more when he rolls over or bends over.  Had it happen years ago but only lasted a really short period of time.  Says it started when he first woke up in AM.  No URI sxs.  No ear pain. Had a HA the first but that has resolved.  NO nausea.  No head injury.   Also concerned about elevation in  BP lately.    ROS      Objective:    BP (!) 133/90   Pulse 69   Ht '5\' 9"'$  (1.753 m)   Wt 237 lb (107.5 kg)   SpO2 95%   BMI 35.00 kg/m    Physical Exam Constitutional:      Appearance: He is well-developed.  HENT:     Head: Normocephalic and atraumatic.     Right Ear: Tympanic membrane, ear canal and external ear normal.     Left Ear: Tympanic membrane, ear canal and external ear normal.     Nose: Nose normal.     Mouth/Throat:     Pharynx: Oropharynx is clear.  Eyes:     Conjunctiva/sclera: Conjunctivae normal.     Pupils: Pupils are equal, round, and reactive to light.  Neck:     Thyroid: No thyromegaly.  Cardiovascular:     Rate and Rhythm: Normal rate.     Heart sounds: Normal heart sounds.  Pulmonary:     Effort: Pulmonary effort is normal.     Breath sounds: Normal breath sounds.  Musculoskeletal:     Cervical back: Neck supple.  Lymphadenopathy:     Cervical: No cervical adenopathy.  Skin:    General: Skin is warm and dry.  Neurological:     Mental Status: [name], place, and time.     Comments: Diks Hallpike Pos to the right.   Psychiatric:        Mood and Affect: Mood normal.        Behavior: Behavior normal.     No results found for any visits on 06/20/22.      Assessment &  Plan:   Problem List Items Addressed This Visit       Cardiovascular and Mediastinum   Primary hypertension - Primary    Discussed BPs have been high recently. Discussed DASH diet, weight reduction, regular exercise. Start ARB. F/U 3 weeks nurse visit and che3ck BMP at that time.       Relevant Medications   olmesartan (BENICAR) 5 MG tablet   atorvastatin (LIPITOR) 20 MG tablet     Other   Hyperlipidemia LDL goal <100    Will inc statin to get LDL less than 70. He is very concerned about fam hx of heart disease in father.  We discussed option on Cardiac Calcium Scoring.  ( His wife is having this done).        Relevant Medications   olmesartan (BENICAR) 5 MG tablet   atorvastatin (LIPITOR) 20 MG tablet   Other Visit Diagnoses     BPPV (benign paroxysmal  positional vertigo), right           BBPV right, - discussed dx and tx. Will start with home exercise. Rec formal vestibular rehab if not improving.   Meds ordered this encounter  Medications   olmesartan (BENICAR) 5 MG tablet    Sig: Take 1 tablet (5 mg total) by mouth daily.    Dispense:  90 tablet    Refill:  0   atorvastatin (LIPITOR) 20 MG tablet    Sig: Take 1 tablet (20 mg total) by mouth at bedtime.    Dispense:  90 tablet    Refill:  3    Return in about 3 weeks (around 07/11/2022) for Nurse visit for BP check up .  Beatrice Lecher, MD

## 2022-06-26 ENCOUNTER — Encounter: Payer: Self-pay | Admitting: Family Medicine

## 2022-06-26 ENCOUNTER — Ambulatory Visit: Payer: BC Managed Care – PPO | Admitting: Family Medicine

## 2022-06-26 VITALS — BP 122/76 | HR 78 | Temp 99.2°F | Ht 69.0 in | Wt 237.0 lb

## 2022-06-26 DIAGNOSIS — J111 Influenza due to unidentified influenza virus with other respiratory manifestations: Secondary | ICD-10-CM | POA: Diagnosis not present

## 2022-06-26 DIAGNOSIS — Z23 Encounter for immunization: Secondary | ICD-10-CM | POA: Diagnosis not present

## 2022-06-26 LAB — POCT INFLUENZA A/B
Influenza A, POC: NEGATIVE
Influenza B, POC: NEGATIVE

## 2022-06-26 MED ORDER — OSELTAMIVIR PHOSPHATE 75 MG PO CAPS: 75.0000 mg | ORAL_CAPSULE | Freq: Two times a day (BID) | ORAL | 0 refills | Status: AC

## 2022-06-26 NOTE — Progress Notes (Signed)
Brandon Williams - 51 y.o. male MRN [id number]  Date of birth: 1970/07/20  Subjective Chief Complaint  Patient presents with   URI    HPI Brandon Williams is a 51 y.o. male here today with flu like symptoms.  He has fever, congestion, fatigue, body aches, and cough.  Symptoms started 2 days ago.  Felt fine after getting off work Tuesday and had fairly rapid onset of symptoms Tuesday evening.  He tested negative for COVID at home yesterday.  He denies dyspnea, chest pain or wheezing.  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   ROS:  A comprehensive ROS was completed and negative except as noted per HPI  No Known Allergies  Past Medical History:  Diagnosis Date   Allergic rhinitis    Allergy    Family history of malignant hyperthermia    paternal grandmother and paternal uncle died in OR from [place]   Hyperlipidemia    Thyroid disease     Past Surgical History:  Procedure Laterality Date   BACK SURGERY     thumb surgery      Social History   Socioeconomic History   Marital status: Married    Spouse name: Not on file   Number of children: Not on file   Years of education: Not on file   Highest education level: Not on file  Occupational History   Not on file  Tobacco Use   Smoking status: Never   Smokeless tobacco: Never  Vaping Use   Vaping Use: Never used  Substance and Sexual Activity   Alcohol use: Yes    Alcohol/week: 1.0 - 2.0 standard drink of alcohol    Types: 1 - 2 Standard drinks or equivalent per week   Drug use: Never   Sexual activity: Yes  Other Topics Concern   Not on file  Social History Narrative   Caffiene 1 cup coffee in am,  soda /tea. Education: HS some college.  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    Social Determinants of Health   Financial Resource Strain: Not on file  Food Insecurity: Not on file  Transportation Needs: Not on file  Physical Activity: Not on file  Stress: Not on file   Social Connections: Not on file    Family History  Problem Relation Age of Onset   Multiple sclerosis Mother    Obstructive Sleep Apnea Mother    Heart disease Father    Heart attack Father    Thyroid disease Sister    Heart disease Paternal Grandfather    Colon cancer Neg Hx    Esophageal cancer Neg Hx    Stomach cancer Neg Hx    Rectal cancer Neg Hx     Health Maintenance  Topic Date Due   Zoster Vaccines- Shingrix (1 of 2) 09/19/2022 (Originally 03/05/1990)   INFLUENZA VACCINE  10/05/2022 (Originally 02/04/2022)   HIV Screening  12/31/2022 (Originally 03/05/1986)   COVID-19 Vaccine (4 - 2023-24 season) 06/21/2023 (Originally 03/07/2022)   COLONOSCOPY (Pts 45-40yr Insurance coverage will need to be confirmed)  03/08/2025   DTaP/Tdap/Td (2 - Td or Tdap) 01/22/2027   Hepatitis C Screening  Completed   HPV VACCINES  Aged Out     ----------------------------------------------------------------------------------------------------------------------------------------------------------------------------------------------------------------- Physical Exam BP 122/76 (BP Location: Left Arm, Patient Position: Sitting, Cuff Size: Normal)   Pulse 78   Ht '5\' 9"'$  (1.753 m)   Wt 237 lb (107.5 kg)   SpO2 96%  BMI 35.00 kg/m   Physical Exam Constitutional:      Appearance: Normal appearance.  HENT:     Head: Normocephalic and atraumatic.  Eyes:     General: No scleral icterus. Cardiovascular:     Rate and Rhythm: Normal rate and regular rhythm.  Musculoskeletal:     Cervical back: Neck supple.  Neurological:     General: No focal deficit present.     Mental Status: He is alert.  Psychiatric:        Mood and Affect: Mood normal.        Behavior: Behavior normal.      ------------------------------------------------------------------------------------------------------------------------------------------------------------------------------------------------------------------- Assessment and Plan  Influenza-like illness Negative for COVID at home.  Tested negative for flu in clinic, however have high suspicion for influenza given rapid onset of symptoms.  Adding course of tamiflu.  Can test for COVID again at home if symptoms continue to worsen.  Continued supportive care recommended with increased fluids, humidifier, OTC meds for symptom control.     Meds ordered this encounter  Medications   oseltamivir (TAMIFLU) 75 MG capsule    Sig: Take 1 capsule (75 mg total) by mouth 2 (two) times daily for 5 days.    Dispense:  10 capsule    Refill:  0    No follow-ups on file.    This visit occurred during the SARS-CoV-2 public health emergency.  Safety protocols were in place, including screening questions prior to the visit, additional usage of staff PPE, and extensive cleaning of exam room while observing appropriate contact time as indicated for disinfecting solutions.

## 2022-06-26 NOTE — Assessment & Plan Note (Signed)
Negative for COVID at home.  Tested negative for flu in clinic, however have high suspicion for influenza given rapid onset of symptoms.  Adding course of tamiflu.  Can test for COVID again at home if symptoms continue to worsen.  Continued supportive care recommended with increased fluids, humidifier, OTC meds for symptom control.

## 2022-07-11 ENCOUNTER — Ambulatory Visit (INDEPENDENT_AMBULATORY_CARE_PROVIDER_SITE_OTHER): Payer: BC Managed Care – PPO | Admitting: Family Medicine

## 2022-07-11 VITALS — BP 123/79 | HR 68 | Ht 69.0 in | Wt 235.0 lb

## 2022-07-11 DIAGNOSIS — Z125 Encounter for screening for malignant neoplasm of prostate: Secondary | ICD-10-CM | POA: Diagnosis not present

## 2022-07-11 DIAGNOSIS — R7309 Other abnormal glucose: Secondary | ICD-10-CM

## 2022-07-11 DIAGNOSIS — I10 Essential (primary) hypertension: Secondary | ICD-10-CM | POA: Diagnosis not present

## 2022-07-11 DIAGNOSIS — E039 Hypothyroidism, unspecified: Secondary | ICD-10-CM

## 2022-07-11 NOTE — Progress Notes (Signed)
Pt presents to clinic today for BP check. Denies any cp/sob/palpitaions/headaches/dizziness or swelling.

## 2022-07-11 NOTE — Progress Notes (Signed)
Medical screening examination/treatment was performed by qualified clinical staff member and as supervising physician I was immediately available for consultation/collaboration. I have reviewed documentation and agree with assessment and plan.  Lanyla Costello, DO  

## 2022-07-12 LAB — HEMOGLOBIN A1C
Hgb A1c MFr Bld: 5.7 % of total Hgb — ABNORMAL HIGH (ref <5.7)
Mean Plasma Glucose: 117 mg/dL
eAG (mmol/L): 6.5 mmol/L

## 2022-07-12 LAB — BASIC METABOLIC PANEL WITH GFR
BUN: 16 mg/dL (ref 7–25)
CO2: 26 mmol/L (ref 20–32)
Calcium: 8.8 mg/dL (ref 8.6–10.3)
Chloride: 105 mmol/L (ref 98–110)
Creat: 0.97 mg/dL (ref 0.70–1.30)
Glucose, Bld: 111 mg/dL — ABNORMAL HIGH (ref 65–99)
Potassium: 4.3 mmol/L (ref 3.5–5.3)
Sodium: 142 mmol/L (ref 135–146)
eGFR: 95 mL/min/{1.73_m2} (ref >=60)

## 2022-07-12 LAB — PSA: PSA: 0.77 ng/mL (ref <=4.00)

## 2022-07-12 LAB — TSH: TSH: 2.04 mIU/L (ref 0.40–4.50)

## 2022-08-07 ENCOUNTER — Other Ambulatory Visit: Payer: Self-pay | Admitting: Family Medicine

## 2022-08-07 DIAGNOSIS — E039 Hypothyroidism, unspecified: Secondary | ICD-10-CM

## 2022-09-12 ENCOUNTER — Other Ambulatory Visit: Payer: Self-pay | Admitting: Family Medicine

## 2022-10-03 IMAGING — DX DG ELBOW COMPLETE 3+V*R*
4 series · 4 of 4 positions shown · non-contrast
Comparison: None.

CLINICAL DATA: Pain at the anterior elbow, question distal biceps
tendinitis

EXAM:
RIGHT ELBOW - COMPLETE 3+ VIEW

[elbow ap]
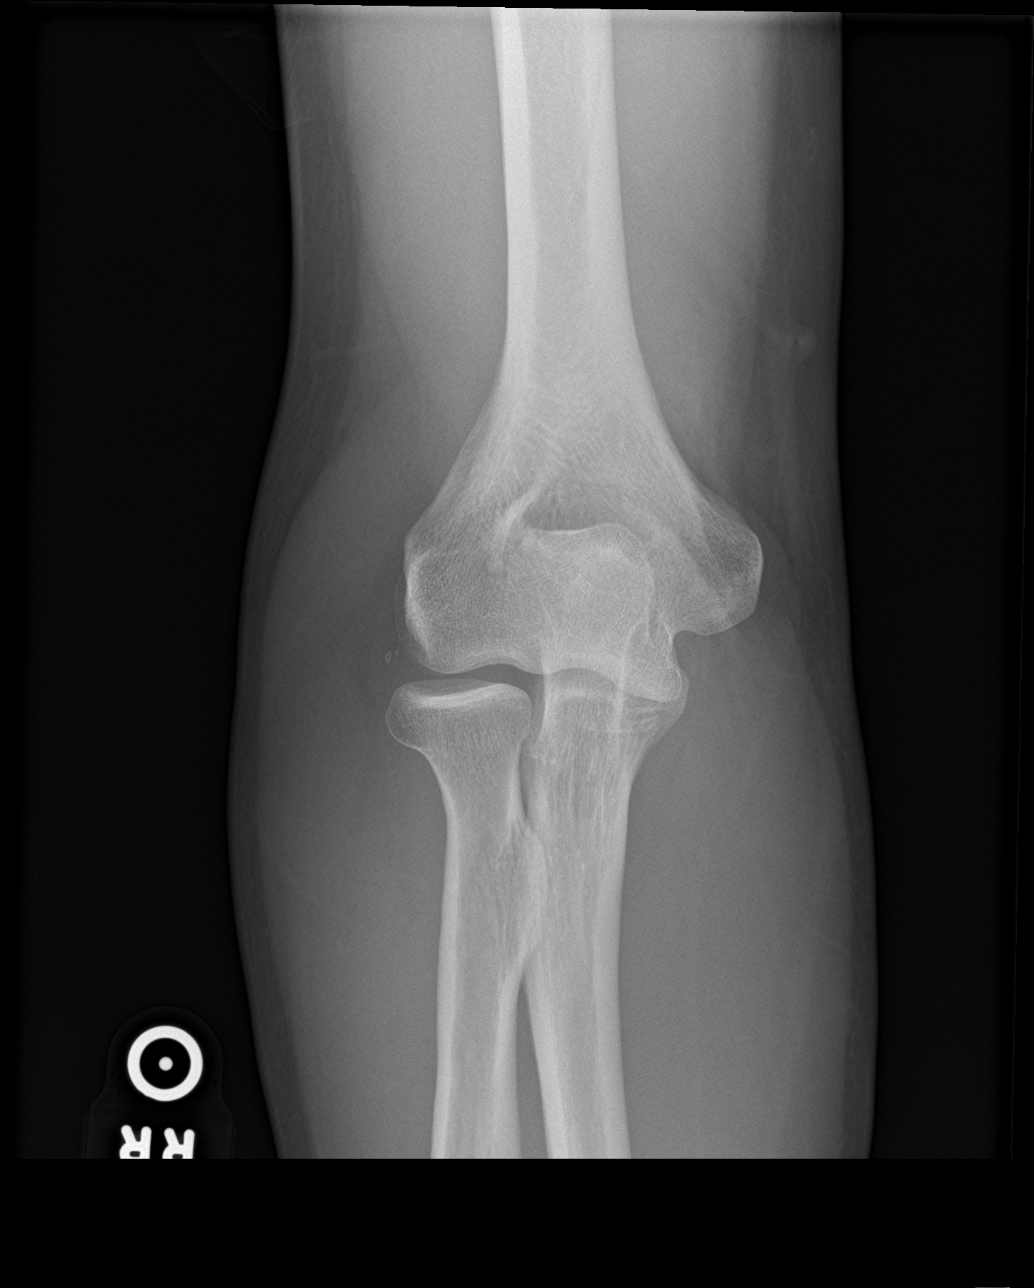

[elbow obl (1 of 2)]
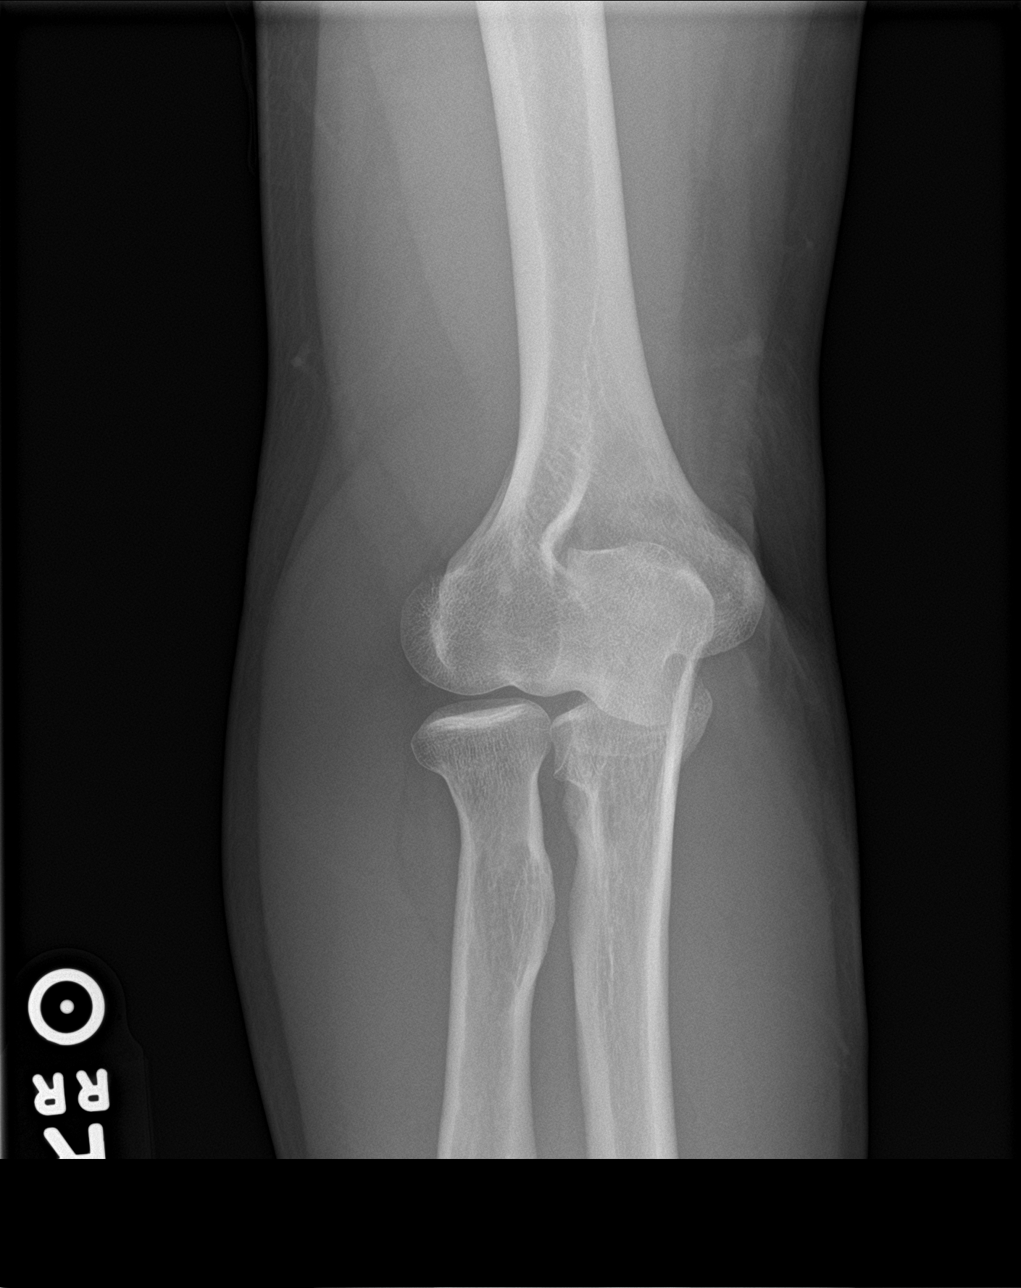

[elbow obl (2 of 2)]
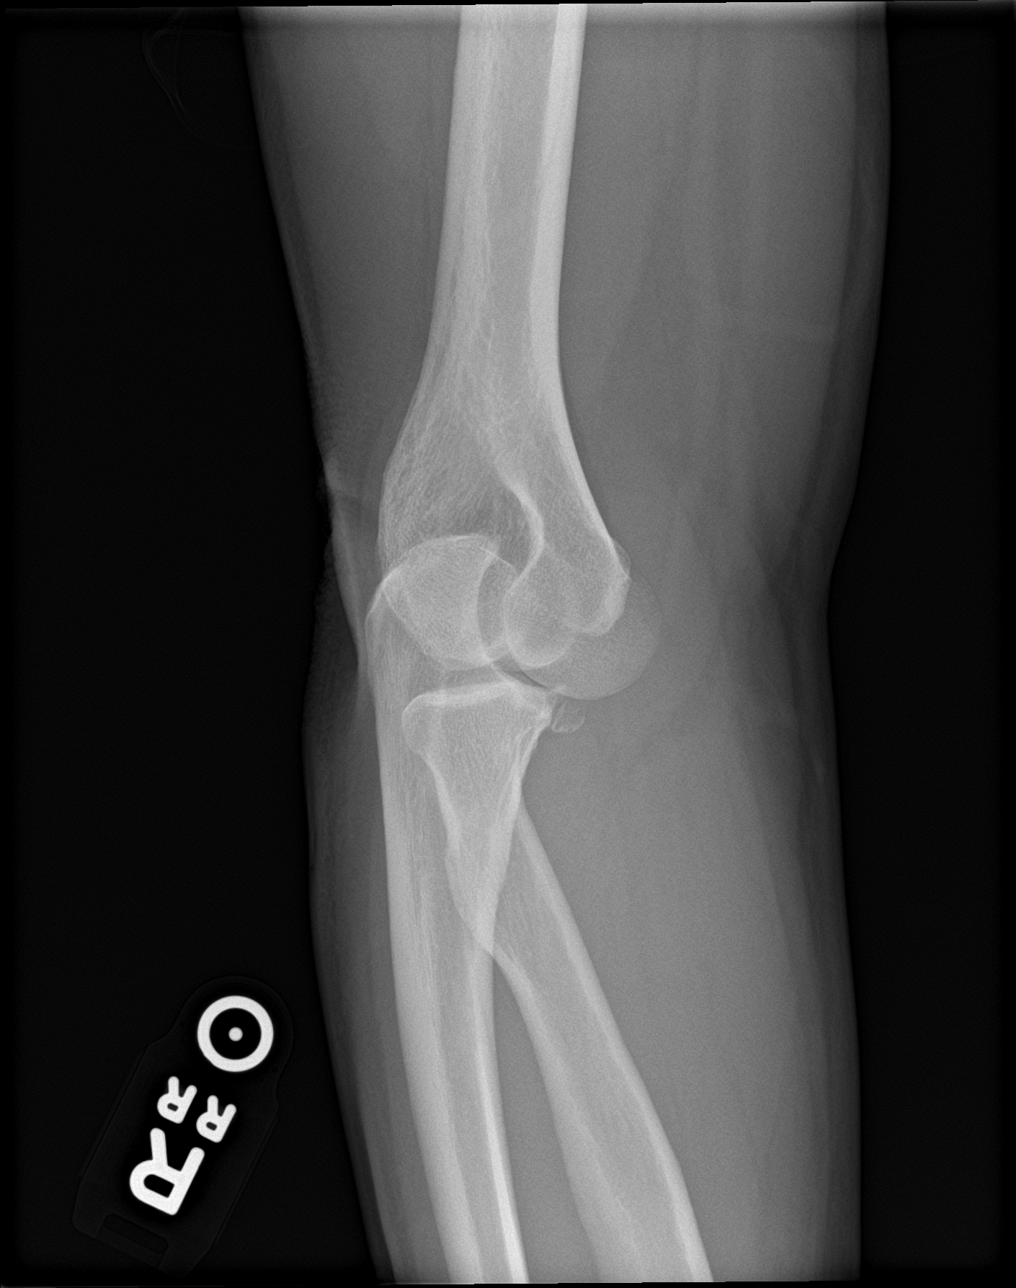

[elbow lat]
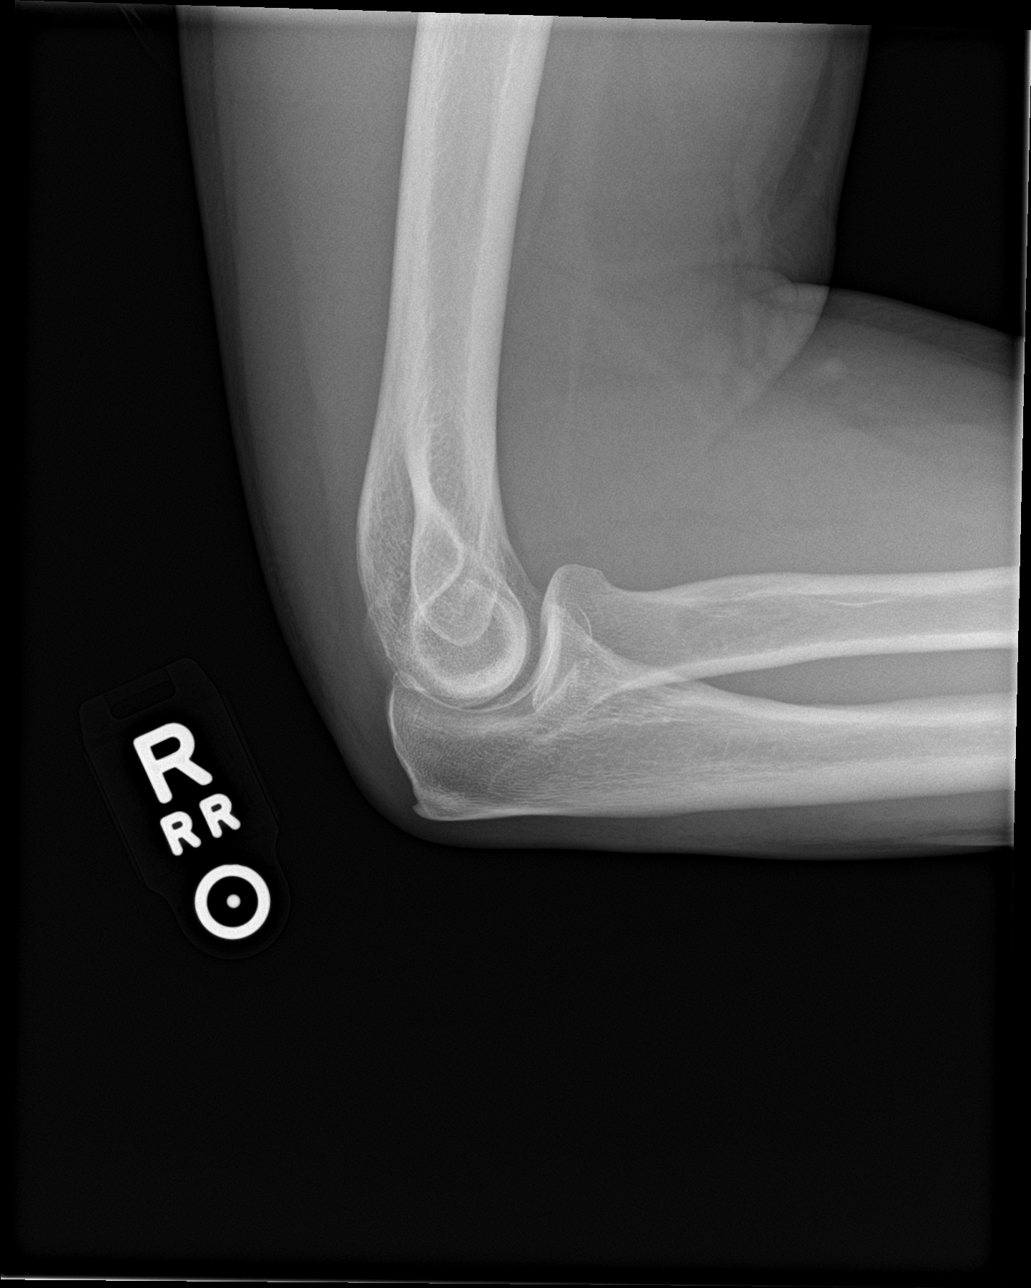

[4 of 4 positions shown; findings below may reference images not displayed]

FINDINGS: Normal alignment with approximation of the joints. No acute fracture
or focal osseous lesion. Well corticated osseous fragment anterior
to the coronoid process may reflect sequela of remote trauma. Soft
tissues are within normal limits.
IMPRESSION: No acute osseous abnormality.

Well corticated osseous fragment along the coronoid process may
reflect sequela of remote trauma.

## 2022-12-08 ENCOUNTER — Other Ambulatory Visit: Payer: Self-pay | Admitting: Family Medicine

## 2022-12-08 DIAGNOSIS — I10 Essential (primary) hypertension: Secondary | ICD-10-CM

## 2023-01-05 ENCOUNTER — Encounter: Payer: Self-pay | Admitting: Family Medicine

## 2023-01-05 DIAGNOSIS — E785 Hyperlipidemia, unspecified: Secondary | ICD-10-CM

## 2023-01-05 DIAGNOSIS — R7303 Prediabetes: Secondary | ICD-10-CM

## 2023-01-05 DIAGNOSIS — I10 Essential (primary) hypertension: Secondary | ICD-10-CM

## 2023-01-09 ENCOUNTER — Ambulatory Visit: Payer: BC Managed Care – PPO | Admitting: Family Medicine

## 2023-01-09 ENCOUNTER — Encounter: Payer: Self-pay | Admitting: Family Medicine

## 2023-01-09 VITALS — BP 110/66 | HR 59 | Ht 69.0 in | Wt 237.0 lb

## 2023-01-09 DIAGNOSIS — E785 Hyperlipidemia, unspecified: Secondary | ICD-10-CM

## 2023-01-09 DIAGNOSIS — E039 Hypothyroidism, unspecified: Secondary | ICD-10-CM

## 2023-01-09 DIAGNOSIS — I10 Essential (primary) hypertension: Secondary | ICD-10-CM

## 2023-01-09 LAB — CBC WITH DIFFERENTIAL/PLATELET
Basophils Relative: 0.4 %
HCT: 44.2 % (ref 38.5–50.0)
MCH: 31.7 pg (ref 27.0–33.0)
MCV: 95.9 fL (ref 80.0–100.0)
MPV: 10.3 fL (ref 7.5–12.5)
RBC: 4.61 10*6/uL (ref 4.20–5.80)
WBC: 4.8 10*3/uL (ref 3.8–10.8)

## 2023-01-09 MED ORDER — OLMESARTAN MEDOXOMIL 5 MG PO TABS: 5.0000 mg | ORAL_TABLET | Freq: Every day | ORAL | 3 refills | Status: DC

## 2023-01-09 NOTE — Assessment & Plan Note (Signed)
BP remains well controlled.  Continue Olmesartan at current strength.

## 2023-01-09 NOTE — Assessment & Plan Note (Signed)
Updated TSH ordered.  Continue levothyroxine at current strength for now.

## 2023-01-09 NOTE — Progress Notes (Signed)
Brandon Williams - 52 y.o. male MRN [id number]  Date of birth: [birth year]  Subjective Chief Complaint  Patient presents with   Hypertension    HPI Brandon Williams is a 52 y.o. male here today for follow up visit.   Reports that he is doing well.  BP remains well controlled with olmesartan at current strength.  No side effects.  Denies chest pain, shortness of breath, palpitations, headache or vision changes.  Feels well at current strength of levothyroxine.  No issues with tolerance of atorvastatin.  He had updated labs drawn this morning.   ROS:  A comprehensive ROS was completed and negative except as noted per HPI  No Known Allergies  Past Medical History:  Diagnosis Date   Allergic rhinitis    Allergy    Family history of malignant hyperthermia    paternal grandmother and paternal uncle died in OR from MH   Hyperlipidemia    Thyroid disease     Past Surgical History:  Procedure Laterality Date   BACK SURGERY     thumb surgery      Social History   Socioeconomic History   Marital status: Married    Spouse name: Not on file   Number of children: Not on file   Years of education: Not on file   Highest education level: Not on file  Occupational History   Not on file  Tobacco Use   Smoking status: Never   Smokeless tobacco: Never  Vaping Use   Vaping Use: Never used  Substance and Sexual Activity   Alcohol use: Yes    Alcohol/week: 1.0 - 2.0 standard drink of alcohol    Types: 1 - 2 Standard drinks or equivalent per week   Drug use: Never   Sexual activity: Yes  Other Topics Concern   Not on file  Social History Narrative   Caffiene 1 cup coffee in am,  soda /tea. Education: HS some college.  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    Social Determinants of Health   Financial Resource Strain: Low Risk  (01/05/2023)   Overall Financial Resource Strain (CARDIA)    Difficulty of Paying Living Expenses: Not hard at all  Food Insecurity: No Food  Insecurity (01/05/2023)   Hunger Vital Sign    Worried About Running Out of Food in the Last Year: Never true    Ran Out of Food in the Last Year: Never true  Transportation Needs: Patient Declined (01/05/2023)   PRAPARE - Administrator, Civil Service (Medical): Patient declined    Lack of Transportation (Non-Medical): Patient declined  Physical Activity: Unknown (01/05/2023)   Exercise Vital Sign    Days of Exercise per Week: 0 days    Minutes of Exercise per Session: Not on file  Stress: No Stress Concern Present (01/05/2023)   [employer] of Occupational Health - Occupational Stress Questionnaire    Feeling of Stress : Not at all  Social Connections: Unknown (01/05/2023)   Social Connection and Isolation Panel [NHANES]    Frequency of Communication with Friends and Family: More than three times a week    Frequency of Social Gatherings with Friends and Family: Once a week    Attends Religious Services: Patient declined    Database administrator or Organizations: Yes    Attends Banker Meetings: 1 to 4 times per year    Marital Status: Married    Family History  Problem Relation Age of Onset  Multiple sclerosis Mother    Obstructive Sleep Apnea Mother    Heart disease Father    Heart attack Father    Thyroid disease Sister    Heart disease Paternal Grandfather    Colon cancer Neg Hx    Esophageal cancer Neg Hx    Stomach cancer Neg Hx    Rectal cancer Neg Hx     Health Maintenance  Topic Date Due   Zoster Vaccines- Shingrix (1 of 2) 04/11/2023 (Originally 03/05/2021)   COVID-19 Vaccine (4 - 2023-24 season) 06/21/2023 (Originally 03/07/2022)   HIV Screening  01/09/2024 (Originally 03/05/1986)   INFLUENZA VACCINE  02/05/2023   Colonoscopy  03/08/2025   DTaP/Tdap/Td (2 - Td or Tdap) 01/22/2027   Hepatitis C Screening  Completed   HPV VACCINES  Aged Out      ----------------------------------------------------------------------------------------------------------------------------------------------------------------------------------------------------------------- Physical Exam BP 110/66 (BP Location: Left Arm, Patient Position: Sitting, Cuff Size: Normal)   Pulse (!) 59   Ht 5\' 9"  (1.753 m)   Wt 237 lb (107.5 kg)   SpO2 94%   BMI 35.00 kg/m   Physical Exam Constitutional:      Appearance: Normal appearance.  HENT:     Head: Normocephalic and atraumatic.  Eyes:     General: No scleral icterus. Cardiovascular:     Rate and Rhythm: Normal rate and regular rhythm.  Pulmonary:     Effort: Pulmonary effort is normal.     Breath sounds: Normal breath sounds.  Musculoskeletal:     Cervical back: Neck supple.  Neurological:     General: No focal deficit present.     Mental Status: He is alert.     ------------------------------------------------------------------------------------------------------------------------------------------------------------------------------------------------------------------- Assessment and Plan  Primary hypertension BP remains well controlled.  Continue Olmesartan at current strength.   Hypothyroidism Updated TSH ordered.  Continue levothyroxine at current strength for now.   Hyperlipidemia LDL goal <100 Tolerating atorvastatin well at current strength.   Meds ordered this encounter  Medications   olmesartan (BENICAR) 5 MG tablet    Sig: Take 1 tablet (5 mg total) by mouth daily.    Dispense:  90 tablet    Refill:  3    Return in about 6 months (around 07/12/2023) for HTN/HLD.    This visit occurred during the SARS-CoV-2 public health emergency.  Safety protocols were in place, including screening questions prior to the visit, additional usage of staff PPE, and extensive cleaning of exam room while observing appropriate contact time as indicated for disinfecting solutions.

## 2023-01-09 NOTE — Assessment & Plan Note (Signed)
Tolerating atorvastatin well at current strength. °

## 2023-01-10 LAB — CBC WITH DIFFERENTIAL/PLATELET
Absolute Monocytes: 571 cells/uL (ref 200–950)
Basophils Absolute: 19 cells/uL (ref 0–200)
Eosinophils Relative: 0.8 %
MCHC: 33 g/dL (ref 32.0–36.0)
RDW: 12.3 % (ref 11.0–15.0)
Total Lymphocyte: 31.6 %

## 2023-01-10 LAB — COMPLETE METABOLIC PANEL WITH GFR
Alkaline phosphatase (APISO): 53 U/L (ref 35–144)
BUN: 17 mg/dL (ref 7–25)
CO2: 25 mmol/L (ref 20–32)
Calcium: 9 mg/dL (ref 8.6–10.3)
Chloride: 106 mmol/L (ref 98–110)
Glucose, Bld: 107 mg/dL — ABNORMAL HIGH (ref 65–99)
Potassium: 4.5 mmol/L (ref 3.5–5.3)
Sodium: 140 mmol/L (ref 135–146)
eGFR: 91 mL/min/{1.73_m2} (ref >=60)

## 2023-01-10 LAB — LIPID PANEL
Cholesterol: 145 mg/dL (ref <200)
Non-HDL Cholesterol (Calc): 107 mg/dL (calc) (ref <130)
Triglycerides: 179 mg/dL — ABNORMAL HIGH (ref <150)

## 2023-01-10 LAB — HEMOGLOBIN A1C: Mean Plasma Glucose: 117 mg/dL

## 2023-01-14 LAB — CBC WITH DIFFERENTIAL/PLATELET
Eosinophils Absolute: 38 cells/uL (ref 15–500)
Monocytes Relative: 11.9 %
Neutro Abs: 2654 cells/uL (ref 1500–7800)
Neutrophils Relative %: 55.3 %
Platelets: 171 10*3/uL (ref 140–400)

## 2023-01-14 LAB — HEMOGLOBIN A1C: eAG (mmol/L): 6.5 mmol/L

## 2023-01-14 LAB — COMPLETE METABOLIC PANEL WITH GFR: AST: 49 U/L — ABNORMAL HIGH (ref 10–35)

## 2023-01-14 LAB — LIPID PANEL
LDL Cholesterol (Calc): 79 mg/dL (calc)
Total CHOL/HDL Ratio: 3.8 (calc) (ref <5.0)

## 2023-01-14 LAB — TEST AUTHORIZATION

## 2023-01-15 LAB — TSH: TSH: 3.11 mIU/L (ref 0.40–4.50)

## 2023-01-15 LAB — COMPLETE METABOLIC PANEL WITH GFR
AG Ratio: 2.1 (calc) (ref 1.0–2.5)
ALT: 50 U/L — ABNORMAL HIGH (ref 9–46)
Albumin: 4.4 g/dL (ref 3.6–5.1)
Creat: 1 mg/dL (ref 0.70–1.30)
Globulin: 2.1 g/dL (calc) (ref 1.9–3.7)
Total Bilirubin: 0.4 mg/dL (ref 0.2–1.2)
Total Protein: 6.5 g/dL (ref 6.1–8.1)

## 2023-01-15 LAB — TEST AUTHORIZATION

## 2023-01-15 LAB — LIPID PANEL: HDL: 38 mg/dL — ABNORMAL LOW (ref >=40)

## 2023-01-15 LAB — HEMOGLOBIN A1C: Hgb A1c MFr Bld: 5.7 % of total Hgb — ABNORMAL HIGH (ref <5.7)

## 2023-01-15 LAB — CBC WITH DIFFERENTIAL/PLATELET
Hemoglobin: 14.6 g/dL (ref 13.2–17.1)
Lymphs Abs: 1517 cells/uL (ref 850–3900)

## 2023-02-02 ENCOUNTER — Other Ambulatory Visit: Payer: Self-pay | Admitting: Family Medicine

## 2023-02-02 DIAGNOSIS — E039 Hypothyroidism, unspecified: Secondary | ICD-10-CM

## 2023-03-16 ENCOUNTER — Encounter: Payer: Self-pay | Admitting: Sports Medicine

## 2023-03-16 ENCOUNTER — Ambulatory Visit: Payer: BC Managed Care – PPO | Admitting: Sports Medicine

## 2023-03-16 DIAGNOSIS — M25561 Pain in right knee: Secondary | ICD-10-CM

## 2023-03-16 DIAGNOSIS — M79641 Pain in right hand: Secondary | ICD-10-CM

## 2023-03-16 DIAGNOSIS — M25562 Pain in left knee: Secondary | ICD-10-CM | POA: Diagnosis not present

## 2023-03-16 MED ORDER — MELOXICAM 15 MG PO TABS
ORAL_TABLET | ORAL | 3 refills | Status: DC
Start: 2023-03-16 — End: 2023-06-03

## 2023-03-16 NOTE — Assessment & Plan Note (Signed)
Right hand pain now for over 6 weeks after a fall, x-rays negative. Pain is at the thumb basal joint, suspect occult fracture, he will continue thumb spica brace, adding meloxicam and he would like an MRI of his right hand.

## 2023-03-16 NOTE — Progress Notes (Signed)
    Procedures performed today:    None.  Independent interpretation of notes and tests performed by another provider:   None.  Brief History, Exam, Impression, and Recommendations:    Right hand pain Right hand pain now for over 6 weeks after a fall, x-rays negative. Pain is at the thumb basal joint, suspect occult fracture, he will continue thumb spica brace, adding meloxicam and he would like an MRI of his right hand.  Bilateral knee pain After a fall 6 weeks ago continues to have bilateral knee pain, anterior, left knee does have some prepatellar bursitis which is nontender, he also has some patellofemoral type pain bilaterally. X-rays were negative in the urgent care after the fall. Adding meloxicam, we can do injections if not better with the follow-up.    ____________________________________________ Brandon Williams. Benjamin Stain, M.D., ABFM., CAQSM., AME. Primary Care and Sports Medicine [facility]  Adjunct Professor of Family Medicine  [place] [employer] [facility] [employer]  Restaurant manager, fast food

## 2023-03-16 NOTE — Assessment & Plan Note (Signed)
After a fall 6 weeks ago continues to have bilateral knee pain, anterior, left knee does have some prepatellar bursitis which is nontender, he also has some patellofemoral type pain bilaterally. X-rays were negative in the urgent care after the fall. Adding meloxicam, we can do injections if not better with the follow-up.

## 2023-03-21 ENCOUNTER — Ambulatory Visit: Payer: BC Managed Care – PPO

## 2023-03-21 DIAGNOSIS — M79641 Pain in right hand: Secondary | ICD-10-CM | POA: Diagnosis not present

## 2023-04-06 ENCOUNTER — Telehealth: Payer: Self-pay | Admitting: Family Medicine

## 2023-04-06 NOTE — Telephone Encounter (Signed)
Pt had an MRI done over two weeks ago. He called this afternoon,because he hasn't received any results yet. Please advise

## 2023-04-07 NOTE — Telephone Encounter (Signed)
Patient advised. He will call back to schedule if he decides to proceed with injection.

## 2023-04-07 NOTE — Telephone Encounter (Signed)
Radiology has been far behind because they lost a bunch of providers, I looked at the images myself and I do see more of a severe osteoarthritis at the thumb basal joint, no obvious fractures, I am still awaiting the official radiologist's report, however if still having significant pain, due to the arthritis and fluid in the joint we could certainly place an injection in to knock it out.

## 2023-04-07 NOTE — Telephone Encounter (Signed)
Left a message for a return call.

## 2023-05-02 ENCOUNTER — Other Ambulatory Visit: Payer: Self-pay | Admitting: Family Medicine

## 2023-05-02 DIAGNOSIS — E039 Hypothyroidism, unspecified: Secondary | ICD-10-CM

## 2023-06-02 NOTE — Progress Notes (Unsigned)
[facility] [address] [place]. [place] [address] [phone]       OFFICE FOLLOW UP NOTE  [name] Date of Birth:  1971-04-20 Medical Record Number:  [id number]   Reason for visit: CPAP follow-up    SUBJECTIVE:   CHIEF COMPLAINT:  No chief complaint on file.  Follow-up visit:  Prior visit: 06/02/2022   Brief HPI:   Brandon Williams is a 52 y.o. male who was evaluated by Dr. Frances Furbish on 08/13/2021 for concern of underlying sleep apnea with reported snoring, excessive daytime somnolence and witnessed apneas.  ESS 15/24.  FSS 50/63.  Completed in lab sleep study 08/27/2021 which showed overall mild OSA although moderate during REM sleep and in supine position, with a total AHI of 6.3/hour, REM AHI of 16.7/hour, supine AHI of  20.4/hour and O2 nadir of 83%.  The near-absence of supine sleep may underestimate AHI and O2 nadir. Given the patient's medical history and sleep related complaints, treatment with positive airway pressure recommended.  CPAP initiated on 09/16/2021.  Pressure setting adjusted 12/2021 due to leaks and discomfort from 5-11 with EPR 1 to 5-10 with EPR 3.   At prior visit, noted improvement of tolerance after pressure setting changes.  Adequate compliance and optimal residual AHI.  ESS 10/24.   Interval history:  Returns today for yearly CPAP compliance visit.  Compliance report as below shows excellent usage and optimal residual AHI.          ROS:   14 system review of systems performed and negative with exception of those listed in HPI  PMH:  Past Medical History:  Diagnosis Date   Allergic rhinitis    Allergy    Family history of malignant hyperthermia    paternal grandmother and paternal uncle died in OR from MH   Hyperlipidemia    Thyroid disease     PSH:  Past Surgical History:  Procedure Laterality Date   BACK SURGERY     thumb surgery      Social History:  Social History   Socioeconomic History    Marital status: Married    Spouse name: Not on file   Number of children: Not on file   Years of education: Not on file   Highest education level: Not on file  Occupational History   Not on file  Tobacco Use   Smoking status: Never   Smokeless tobacco: Never  Vaping Use   Vaping status: Never Used  Substance and Sexual Activity   Alcohol use: Yes    Alcohol/week: 1.0 - 2.0 standard drink of alcohol    Types: 1 - 2 Standard drinks or equivalent per week   Drug use: Never   Sexual activity: Yes  Other Topics Concern   Not on file  Social History Narrative   Caffiene 1 cup coffee in am,  soda /tea. Education: HS some college.  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    Social Determinants of Health   Financial Resource Strain: Low Risk  (01/05/2023)   Overall Financial Resource Strain (CARDIA)    Difficulty of Paying Living Expenses: Not hard at all  Food Insecurity: No Food Insecurity (01/05/2023)   Hunger Vital Sign    Worried About Running Out of Food in the Last Year: Never true    Ran Out of Food in the Last Year: Never true  Transportation Needs: Patient Declined (01/05/2023)   PRAPARE - Administrator, Civil Service (Medical): Patient declined  Lack of Transportation (Non-Medical): Patient declined  Physical Activity: Unknown (01/05/2023)   Exercise Vital Sign    Days of Exercise per Week: 0 days    Minutes of Exercise per Session: Not on file  Stress: No Stress Concern Present (01/05/2023)   [employer] of Occupational Health - Occupational Stress Questionnaire    Feeling of Stress : Not at all  Social Connections: Unknown (01/05/2023)   Social Connection and Isolation Panel [NHANES]    Frequency of Communication with Friends and Family: More than three times a week    Frequency of Social Gatherings with Friends and Family: Once a week    Attends Religious Services: Patient declined    Database administrator or Organizations: Yes    Attends Tax inspector Meetings: 1 to 4 times per year    Marital Status: Married  Catering manager Violence: Unknown (10/07/2021)   Received from [employer], [facility]   HITS    Physically Hurt: Not on file    Insult or Talk Down To: Not on file    Threaten Physical Harm: Not on file    Scream or Curse: Not on file    Family History:  Family History  Problem Relation Age of Onset   Multiple sclerosis Mother    Obstructive Sleep Apnea Mother    Heart disease Father    Heart attack Father    Thyroid disease Sister    Heart disease Paternal Grandfather    Colon cancer Neg Hx    Esophageal cancer Neg Hx    Stomach cancer Neg Hx    Rectal cancer Neg Hx     Medications:   Current Outpatient Medications on File Prior to Visit  Medication Sig Dispense Refill   atorvastatin (LIPITOR) 20 MG tablet Take 1 tablet (20 mg total) by mouth at bedtime. 90 tablet 3   glucosamine-chondroitin 500-400 MG tablet Take 1 tablet by mouth daily.     KRILL OIL PO Take 500 mg by mouth daily.     levothyroxine (SYNTHROID) 75 MCG tablet TAKE 1 TABLET BY MOUTH ONCE DAILY BEFORE BREAKFAST 90 tablet 0   meloxicam (MOBIC) 15 MG tablet One tab PO every 24 hours with a meal for 2 weeks, then once every 24 hours prn pain. 30 tablet 3   Multiple Vitamin (MULTI VITAMIN MENS PO) Take 1 tablet by mouth daily.     olmesartan (BENICAR) 5 MG tablet Take 1 tablet (5 mg total) by mouth daily. 90 tablet 3   No current facility-administered medications on file prior to visit.    Allergies:  No Known Allergies    OBJECTIVE:  Physical Exam  There were no vitals filed for this visit.  There is no height or weight on file to calculate BMI. No results found.  General: well developed, well nourished, very pleasant middle-age Caucasian male, seated, in no evident distress Head: head normocephalic and atraumatic.   Neck: supple with no carotid or supraclavicular bruits Cardiovascular: regular rate and rhythm, no  murmurs Musculoskeletal: no deformity Skin:  no rash/petichiae Vascular:  Normal pulses all extremities   Neurologic Exam Mental Status: Awake and fully alert. Oriented to place and time. Recent and remote memory intact. Attention span, concentration and fund of knowledge appropriate. Mood and affect appropriate.  Cranial Nerves: Pupils equal, briskly reactive to Brandon. Extraocular movements full without nystagmus. Visual fields full to confrontation. Hearing intact. Facial sensation intact. Face, tongue, palate moves normally and symmetrically.  Motor: Normal bulk and  tone. Normal strength in all tested extremity muscles Sensory.: intact to touch , pinprick , position and vibratory sensation.  Coordination: Rapid alternating movements normal in all extremities. Finger-to-nose and heel-to-shin performed accurately bilaterally. Gait and Station: Arises from chair without difficulty. Stance is normal. Gait demonstrates normal stride length and balance without use of AD Reflexes: 1+ and symmetric. Toes downgoing.         ASSESSMENT: Brandon Williams is a 52 y.o. year old male with diagnosis of mild to moderate OSA after completion of in lab sleep study in 08/2021 and initiation of CPAP 09/16/2021.      PLAN:  OSA on CPAP : Compliance report shows excellent compliance with optimal residual AHI.  Continue current pressure settings. Discussed importance of continue nightly usage and ensuring greater than 4 hours nightly for optimal benefit and for insurance purposes.  He will continue to follow with DME company for any needed supplies or CPAP related concerns.    Follow up in 1 year or call earlier if needed   CC:  PCP: Everrett Coombe, DO    I spent 21 minutes of face-to-face and non-face-to-face time with patient.  This included previsit chart review, lab review, study review, order entry, electronic health record documentation, patient education regarding sleep apnea with review and  discussion of compliance report and answered all other questions to patient's satisfaction   Ihor Austin, [facility]  [facility] [address] [place], [place] [address]  Phone [phone] Fax [phone] Note: This document was prepared with digital dictation and possible smart phrase technology. Any transcriptional errors that result from this process are unintentional.

## 2023-06-03 ENCOUNTER — Ambulatory Visit: Payer: BC Managed Care – PPO | Admitting: Adult Health

## 2023-06-03 ENCOUNTER — Encounter: Payer: Self-pay | Admitting: Adult Health

## 2023-06-03 VITALS — BP 132/77 | HR 70 | Ht 69.0 in | Wt 240.0 lb

## 2023-06-03 DIAGNOSIS — G4733 Obstructive sleep apnea (adult) (pediatric): Secondary | ICD-10-CM

## 2023-06-03 NOTE — Patient Instructions (Signed)
Your Plan:  Continue nightly use of CPAP for adequate sleep apnea  Continue to follow with DME [facility] for any needs supplies or CPAP related concerns   Follow-up in 1 year or call earlier if needed    Thank you for coming to see [place] at [facility]. I hope we have been able to provide you high quality care today.  You may receive a patient satisfaction survey over the next few weeks. We would appreciate your feedback and comments so that we may continue to improve ourselves and the health of our patients.

## 2023-06-05 ENCOUNTER — Other Ambulatory Visit: Payer: Self-pay | Admitting: Family Medicine

## 2023-06-05 DIAGNOSIS — E785 Hyperlipidemia, unspecified: Secondary | ICD-10-CM

## 2023-06-08 ENCOUNTER — Telehealth: Payer: Self-pay

## 2023-07-06 ENCOUNTER — Other Ambulatory Visit: Payer: Self-pay | Admitting: Medical-Surgical

## 2023-07-06 ENCOUNTER — Telehealth: Payer: Self-pay

## 2023-07-06 DIAGNOSIS — R7303 Prediabetes: Secondary | ICD-10-CM

## 2023-07-06 DIAGNOSIS — E039 Hypothyroidism, unspecified: Secondary | ICD-10-CM

## 2023-07-06 DIAGNOSIS — E785 Hyperlipidemia, unspecified: Secondary | ICD-10-CM

## 2023-07-06 DIAGNOSIS — I10 Essential (primary) hypertension: Secondary | ICD-10-CM

## 2023-07-06 NOTE — Telephone Encounter (Signed)
Copied from CRM [id number]. Topic: Clinical - Request for Lab/Test Order >> Jul 06, 2023  4:08 PM Brandon Williams wrote: Reason for CRM: patient requesting blood work labs before his appointment , the appointment is on 07/13/2023 at 8:30am

## 2023-07-07 NOTE — Telephone Encounter (Signed)
 Pt notified through MyChart. Roselyn Reef, CMA

## 2023-07-10 LAB — CBC WITH DIFFERENTIAL/PLATELET
Basophils Absolute: 0 10*3/uL (ref 0.0–0.2)
Basos: 0 %
EOS (ABSOLUTE): 0 10*3/uL (ref 0.0–0.4)
Eos: 1 %
Hematocrit: 47.3 % (ref 37.5–51.0)
Hemoglobin: 15.6 g/dL (ref 13.0–17.7)
Immature Grans (Abs): 0 10*3/uL (ref 0.0–0.1)
Immature Granulocytes: 0 %
Lymphocytes Absolute: 1.6 10*3/uL (ref 0.7–3.1)
Lymphs: 26 %
MCH: 31.5 pg (ref 26.6–33.0)
MCHC: 33 g/dL (ref 31.5–35.7)
MCV: 95 fL (ref 79–97)
Monocytes Absolute: 0.6 10*3/uL (ref 0.1–0.9)
Monocytes: 10 %
Neutrophils Absolute: 3.9 10*3/uL (ref 1.4–7.0)
Neutrophils: 63 %
Platelets: 184 10*3/uL (ref 150–450)
RBC: 4.96 x10E6/uL (ref 4.14–5.80)
RDW: 12.2 % (ref 11.6–15.4)
WBC: 6.2 10*3/uL (ref 3.4–10.8)

## 2023-07-10 LAB — CMP14+EGFR
ALT: 59 [IU]/L — ABNORMAL HIGH (ref 0–44)
AST: 56 [IU]/L — ABNORMAL HIGH (ref 0–40)
Albumin: 4.7 g/dL (ref 3.8–4.9)
Alkaline Phosphatase: 68 [IU]/L (ref 44–121)
BUN/Creatinine Ratio: 15 (ref 9–20)
BUN: 16 mg/dL (ref 6–24)
Bilirubin Total: 0.5 mg/dL (ref 0.0–1.2)
CO2: 25 mmol/L (ref 20–29)
Calcium: 9.2 mg/dL (ref 8.7–10.2)
Chloride: 99 mmol/L (ref 96–106)
Creatinine, Ser: 1.04 mg/dL (ref 0.76–1.27)
Globulin, Total: 2.1 g/dL (ref 1.5–4.5)
Glucose: 97 mg/dL (ref 70–99)
Potassium: 4.8 mmol/L (ref 3.5–5.2)
Sodium: 145 mmol/L — ABNORMAL HIGH (ref 134–144)
Total Protein: 6.8 g/dL (ref 6.0–8.5)
eGFR: 86 mL/min/{1.73_m2} (ref >59)

## 2023-07-10 LAB — HEMOGLOBIN A1C
Est. average glucose Bld gHb Est-mCnc: 117 mg/dL
Hgb A1c MFr Bld: 5.7 % — ABNORMAL HIGH (ref 4.8–5.6)

## 2023-07-10 LAB — LIPID PANEL
Chol/HDL Ratio: 4.4 {ratio} (ref 0.0–5.0)
Cholesterol, Total: 164 mg/dL (ref 100–199)
HDL: 37 mg/dL — ABNORMAL LOW (ref >39)
LDL Chol Calc (NIH): 83 mg/dL (ref 0–99)
Triglycerides: 263 mg/dL — ABNORMAL HIGH (ref 0–149)
VLDL Cholesterol Cal: 44 mg/dL — ABNORMAL HIGH (ref 5–40)

## 2023-07-10 LAB — TSH: TSH: 3.04 u[IU]/mL (ref 0.450–4.500)

## 2023-07-13 ENCOUNTER — Encounter: Payer: Self-pay | Admitting: Family Medicine

## 2023-07-13 ENCOUNTER — Ambulatory Visit (INDEPENDENT_AMBULATORY_CARE_PROVIDER_SITE_OTHER): Payer: 59 | Admitting: Family Medicine

## 2023-07-13 VITALS — BP 133/86 | HR 74 | Ht 69.0 in | Wt 236.0 lb

## 2023-07-13 DIAGNOSIS — E785 Hyperlipidemia, unspecified: Secondary | ICD-10-CM

## 2023-07-13 DIAGNOSIS — I10 Essential (primary) hypertension: Secondary | ICD-10-CM | POA: Diagnosis not present

## 2023-07-13 DIAGNOSIS — R748 Abnormal levels of other serum enzymes: Secondary | ICD-10-CM | POA: Diagnosis not present

## 2023-07-13 DIAGNOSIS — E039 Hypothyroidism, unspecified: Secondary | ICD-10-CM

## 2023-07-13 MED ORDER — LEVOTHYROXINE SODIUM 75 MCG PO TABS: ORAL_TABLET | ORAL | 3 refills | Status: DC

## 2023-07-13 NOTE — Assessment & Plan Note (Signed)
 BP remains well controlled.  Continue olmesartan at current strength.  Dietary changes encouraged.  F/u in 6 months.

## 2023-07-13 NOTE — Assessment & Plan Note (Signed)
 TSH WNL.  Continue current strength of levothyroxine.

## 2023-07-13 NOTE — Assessment & Plan Note (Signed)
 LFT's remain elevated but stable.  Encouraged dietary changes to help with reversal of fatty liver.

## 2023-07-13 NOTE — Progress Notes (Signed)
 Brandon Williams - 53 y.o. male MRN [id number]  Date of birth: 01/18/1971  Subjective Chief Complaint  Patient presents with   Rash    HPI Brandon Williams is a 53 y.o. male here today for follow up visit.   He reports that he is doing well.  Recently changed brands of beet root and had some itching after starting this.  He has discontinued with improvement in his symptoms.  He did not have any issues with the previous brand.  He does remain on olmesartan  for HTN.  BP is well controlled.  Denies chest pain, shortness of breath, palpitations, headache or vision changes.   He continues on atorvastatin  for management of HLD.  LDL is well controlled.  Mild elevation in LFT's noted on recent labs.  Denies RUQ pain.  TG remain elevated.  He is taking krill oil.    TSH remains within range.  Doing well with current strength of levothyroxine .   ROS:  A comprehensive ROS was completed and negative except as noted per HPI  No Known Allergies  Past Medical History:  Diagnosis Date   Allergic rhinitis    Allergy    Family history of malignant hyperthermia    paternal grandmother and paternal uncle died in OR from MH   Hyperlipidemia    Thyroid disease     Past Surgical History:  Procedure Laterality Date   BACK SURGERY     thumb surgery      Social History   Socioeconomic History   Marital status: Married    Spouse name: Not on file   Number of children: Not on file   Years of education: Not on file   Highest education level: Not on file  Occupational History   Not on file  Tobacco Use   Smoking status: Never   Smokeless tobacco: Never  Vaping Use   Vaping status: Never Used  Substance and Sexual Activity   Alcohol use: Yes    Alcohol/week: 4.0 standard drinks of alcohol    Types: 2 Cans of beer, 2 Shots of liquor per week   Drug use: Never   Sexual activity: Yes  Other Topics Concern   Not on file  Social History Narrative   Caffiene 1 cup coffee in am,  soda /tea.  Education: HS some college.  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    Social Drivers of Corporate Investment Banker Strain: Low Risk  (07/12/2023)   Overall Financial Resource Strain (CARDIA)    Difficulty of Paying Living Expenses: Not hard at all  Food Insecurity: No Food Insecurity (07/12/2023)   Hunger Vital Sign    Worried About Running Out of Food in the Last Year: Never true    Ran Out of Food in the Last Year: Never true  Transportation Needs: No Transportation Needs (07/12/2023)   PRAPARE - Administrator, Civil Service (Medical): No    Lack of Transportation (Non-Medical): No  Physical Activity: Insufficiently Active (07/12/2023)   Exercise Vital Sign    Days of Exercise per Week: 2 days    Minutes of Exercise per Session: 30 min  Stress: No Stress Concern Present (07/12/2023)   [employer] of Occupational Health - Occupational Stress Questionnaire    Feeling of Stress : Only a little  Social Connections: Unknown (07/12/2023)   Social Connection and Isolation Panel [NHANES]    Frequency of Communication with Friends and Family: More than three times a week  Frequency of Social Gatherings with Friends and Family: Once a week    Attends Religious Services: Patient declined    Active Member of Clubs or Organizations: Patient declined    Attends Banker Meetings: 1 to 4 times per year    Marital Status: Married    Family History  Problem Relation Age of Onset   Multiple sclerosis Mother    Obstructive Sleep Apnea Mother    Heart disease Father    Heart attack Father    Thyroid disease Sister    Heart disease Paternal Grandfather    Colon cancer Neg Hx    Esophageal cancer Neg Hx    Stomach cancer Neg Hx    Rectal cancer Neg Hx     Health Maintenance  Topic Date Due   HIV Screening  01/09/2024 (Originally 03/05/1986)   COVID-19 Vaccine (4 - 2024-25 season) 01/10/2024 (Originally 03/08/2023)   Zoster Vaccines- Shingrix (1 of 2) 01/10/2024  (Originally 03/05/2021)   Colonoscopy  03/08/2025   DTaP/Tdap/Td (2 - Td or Tdap) 01/22/2027   INFLUENZA VACCINE  Completed   Hepatitis C Screening  Completed   HPV VACCINES  Aged Out     ----------------------------------------------------------------------------------------------------------------------------------------------------------------------------------------------------------------- Physical Exam BP 135/87 (BP Location: Left Arm, Patient Position: Sitting, Cuff Size: Large)   Pulse 74   Ht 5' 9 (1.753 m)   Wt 236 lb (107 kg)   SpO2 95%   BMI 34.85 kg/m   Physical Exam Constitutional:      Appearance: Normal appearance.  HENT:     Head: Normocephalic and atraumatic.  Eyes:     General: No scleral icterus. Cardiovascular:     Rate and Rhythm: Normal rate and regular rhythm.  Pulmonary:     Effort: Pulmonary effort is normal.     Breath sounds: Normal breath sounds.  Neurological:     Mental Status: He is alert.  Psychiatric:        Mood and Affect: Mood normal.        Behavior: Behavior normal.     ------------------------------------------------------------------------------------------------------------------------------------------------------------------------------------------------------------------- Assessment and Plan  Primary hypertension BP remains well controlled.  Continue olmesartan  at current strength.  Dietary changes encouraged.  F/u in 6 months.    Hypothyroidism TSH WNL.  Continue current strength of levothyroxine .   Elevated liver enzymes LFT's remain elevated but stable.  Encouraged dietary changes to help with reversal of fatty liver.     No orders of the defined types were placed in this encounter.   No follow-ups on file.    This visit occurred during the SARS-CoV-2 public health emergency.  Safety protocols were in place, including screening questions prior to the visit, additional usage of staff PPE, and extensive cleaning  of exam room while observing appropriate contact time as indicated for disinfecting solutions.

## 2023-08-18 ENCOUNTER — Other Ambulatory Visit: Payer: Self-pay

## 2023-08-18 DIAGNOSIS — E039 Hypothyroidism, unspecified: Secondary | ICD-10-CM

## 2023-08-18 MED ORDER — LEVOTHYROXINE SODIUM 75 MCG PO TABS: ORAL_TABLET | ORAL | 3 refills | Status: AC

## 2023-08-19 ENCOUNTER — Other Ambulatory Visit: Payer: Self-pay | Admitting: Sports Medicine

## 2023-08-19 DIAGNOSIS — M79641 Pain in right hand: Secondary | ICD-10-CM

## 2023-08-26 ENCOUNTER — Other Ambulatory Visit: Payer: Self-pay | Admitting: Medical Genetics

## 2023-09-15 ENCOUNTER — Encounter: Payer: Self-pay | Admitting: Sports Medicine

## 2023-09-15 ENCOUNTER — Ambulatory Visit

## 2023-09-15 ENCOUNTER — Ambulatory Visit (INDEPENDENT_AMBULATORY_CARE_PROVIDER_SITE_OTHER): Admitting: Sports Medicine

## 2023-09-15 DIAGNOSIS — M7541 Impingement syndrome of right shoulder: Secondary | ICD-10-CM | POA: Diagnosis not present

## 2023-09-15 DIAGNOSIS — M25511 Pain in right shoulder: Secondary | ICD-10-CM | POA: Diagnosis not present

## 2023-09-15 MED ORDER — MELOXICAM 15 MG PO TABS: ORAL_TABLET | ORAL | 3 refills | Status: DC

## 2023-09-15 NOTE — Assessment & Plan Note (Signed)
 Brandon Williams returns, he about December he was doing some push-ups, felt pain right shoulder over the deltoid. Since then he has had some weakness. On exam he has significantly positive rotator cuff signs with a positive Hawkins, Neer, empty can signs, he has significant weakness to the empty can test consistent with supraspinatus tearing, he also has a strongly positive speeds test concerning for biceps pathology. Due to the weakness we will proceed with x-rays and an MRI, I do suspect he has a supraspinatus tear and will need arthroscopic intervention. Meloxicam for pain in the meantime, I will write him a work note as well. Return to see me to go over MRI results.

## 2023-09-15 NOTE — Progress Notes (Signed)
    Procedures performed today:    None.  Independent interpretation of notes and tests performed by another provider:   None.  Brief History, Exam, Impression, and Recommendations:    Impingement syndrome, shoulder, right Brandon Williams returns, he about December he was doing some push-ups, felt pain right shoulder over the deltoid. Since then he has had some weakness. On exam he has significantly positive rotator cuff signs with a positive Hawkins, Neer, empty can signs, he has significant weakness to the empty can test consistent with supraspinatus tearing, he also has a strongly positive speeds test concerning for biceps pathology. Due to the weakness we will proceed with x-rays and an MRI, I do suspect he has a supraspinatus tear and will need arthroscopic intervention. Meloxicam for pain in the meantime, I will write him a work note as well. Return to see me to go over MRI results.    ____________________________________________ Ihor Austin. Benjamin Stain, M.D., ABFM., CAQSM., AME. Primary Care and Sports Medicine [facility]  Adjunct Professor of Family Medicine  [employer]  Restaurant manager, fast food

## 2023-09-26 ENCOUNTER — Ambulatory Visit

## 2023-09-26 DIAGNOSIS — M7541 Impingement syndrome of right shoulder: Secondary | ICD-10-CM

## 2023-10-19 ENCOUNTER — Ambulatory Visit (INDEPENDENT_AMBULATORY_CARE_PROVIDER_SITE_OTHER): Admitting: Sports Medicine

## 2023-10-19 ENCOUNTER — Other Ambulatory Visit (INDEPENDENT_AMBULATORY_CARE_PROVIDER_SITE_OTHER)

## 2023-10-19 ENCOUNTER — Encounter: Payer: Self-pay | Admitting: Sports Medicine

## 2023-10-19 DIAGNOSIS — M7541 Impingement syndrome of right shoulder: Secondary | ICD-10-CM

## 2023-10-19 MED ORDER — TRIAMCINOLONE ACETONIDE 40 MG/ML IJ SUSP
40.0000 mg | Freq: Once | INTRAMUSCULAR | Status: AC
  Administered 2023-10-19: 40 mg via INTRAMUSCULAR

## 2023-10-19 NOTE — Assessment & Plan Note (Signed)
 Pleasant 62 male, he has had pain in the right shoulder for approximately 4 months now, on exam at the last visit he did have some weakness with impingement signs, due to concern for cuff tear and biceps pathology we obtained an MRI, no cuff or bicipital tearing was noted however there was significant tendinosis and bursitis. Due to persistence of pain we will proceed with a subacromial injection today, we will add some home physical therapy, return to see me in 6 weeks.

## 2023-10-19 NOTE — Addendum Note (Signed)
 Addended by: OLIVA-AVELLANEDA, Vega [place] L on: 10/19/2023 10:11 AM   Modules accepted: Orders

## 2023-10-19 NOTE — Progress Notes (Signed)
    Procedures performed today:    Procedure: Real-time Ultrasound Guided injection of the right subacromial bursa Device: Samsung HS60  Verbal informed consent obtained.  Time-out conducted.  Noted no overlying erythema, induration, or other signs of local infection.  Skin prepped in a sterile fashion.  Local anesthesia: Topical Ethyl chloride.  With sterile technique and under real time ultrasound guidance: Noted mild bursitis, 1 cc Kenalog 40, 2 cc lidocaine, 2 cc bupivacaine injected easily Completed without difficulty  Advised to call if fevers/chills, erythema, induration, drainage, or persistent bleeding.  Images permanently stored and available for review in PACS.  Impression: Technically successful ultrasound guided injection.  Independent interpretation of notes and tests performed by another provider:   None.  Brief History, Exam, Impression, and Recommendations:    Impingement syndrome, shoulder, right Pleasant 16 male, he has had pain in the right shoulder for approximately 4 months now, on exam at the last visit he did have some weakness with impingement signs, due to concern for cuff tear and biceps pathology we obtained an MRI, no cuff or bicipital tearing was noted however there was significant tendinosis and bursitis. Due to persistence of pain we will proceed with a subacromial injection today, we will add some home physical therapy, return to see me in 6 weeks.    ____________________________________________ Joselyn Nicely. Sandy Crumb, M.D., ABFM., CAQSM., AME. Primary Care and Sports Medicine [place]  Adjunct Professor of [facility] Medicine  [employer]  Restaurant manager, fast food

## 2023-12-01 ENCOUNTER — Ambulatory Visit: Admitting: Sports Medicine

## 2023-12-04 ENCOUNTER — Ambulatory Visit (INDEPENDENT_AMBULATORY_CARE_PROVIDER_SITE_OTHER): Admitting: Sports Medicine

## 2023-12-04 DIAGNOSIS — M7541 Impingement syndrome of right shoulder: Secondary | ICD-10-CM

## 2023-12-04 NOTE — Progress Notes (Signed)
    Procedures performed today:    None.  Independent interpretation of notes and tests performed by another provider:   None.  Brief History, Exam, Impression, and Recommendations:    Impingement syndrome, shoulder, right Very pleasant 53 year old male, chronic right shoulder pain for approximately 4 months at the time I saw him in April, he had some weakness and impingement signs, we obtained an MRI that showed no cuff or bicipital tearing but there was significant rotator cuff tendinosis and bursitis, at the last visit I did an ultrasound-guided subacromial injection, he had 100% relief. He continued with his home therapy, and then he watched his wife's car, unfortunately the repetitive motion set off his pain a bit, but nowhere near to before his injection, he will restart his meloxicam , continue home conditioning, avoid repetitive motion for the next month and return to see me as needed, overall he is happy with how things are going.    ____________________________________________ Joselyn Nicely. Sandy Crumb, M.D., ABFM., CAQSM., AME. Primary Care and Sports Medicine [facility] [facility]  Adjunct Professor of [facility] Medicine  [employer]  Restaurant manager, fast food

## 2023-12-04 NOTE — Assessment & Plan Note (Signed)
 Very pleasant 53 year old male, chronic right shoulder pain for approximately 4 months at the time I saw him in April, he had some weakness and impingement signs, we obtained an MRI that showed no cuff or bicipital tearing but there was significant rotator cuff tendinosis and bursitis, at the last visit I did an ultrasound-guided subacromial injection, he had 100% relief. He continued with his home therapy, and then he watched his wife's car, unfortunately the repetitive motion set off his pain a bit, but nowhere near to before his injection, he will restart his meloxicam , continue home conditioning, avoid repetitive motion for the next month and return to see me as needed, overall he is happy with how things are going.

## 2023-12-08 ENCOUNTER — Ambulatory Visit
Admission: RE | Admit: 2023-12-08 | Discharge: 2023-12-08 | Disposition: A | Discharge status: Home / Self Care | Source: Ambulatory Visit | Attending: Family Medicine | Admitting: Family Medicine

## 2023-12-08 VITALS — BP 133/80 | HR 65 | Temp 98.3°F | Resp 17

## 2023-12-08 DIAGNOSIS — S0101XA Laceration without foreign body of scalp, initial encounter: Secondary | ICD-10-CM | POA: Diagnosis not present

## 2023-12-08 NOTE — ED Provider Notes (Signed)
 Brandon Williams [facility]    CSN: [id number] Arrival date & time: 12/08/23  1608      History   Chief Complaint Chief Complaint  Patient presents with   Laceration    scalp    HPI KHYRON Williams is a 53 y.o. male.   HPI Patient is a 53 year old male who presents to the urgent care today with concerns of a laceration to his scalp.  He reports it happened around 10:30 AM this morning when he accidentally hit his head on the van. At that time, he had a coworker who is a paramedic who helped rinse out that area for him.  He was able to control the bleeding.  He denies any loss of consciousness, vomiting, dizziness, changes in vision, neurologic changes, or other concerns at this time.  Lastly, patient up-to-date on tetanus. Past Medical History:  Diagnosis Date   Allergic rhinitis    Allergy    Family history of malignant hyperthermia    paternal grandmother and paternal uncle died in OR from [facility]   Hyperlipidemia    Thyroid disease     Patient Active Problem List   Diagnosis Date Noted   Right hand pain 03/16/2023   Bilateral knee pain 03/16/2023   Primary hypertension 06/20/2022   Well adult exam 12/30/2021   Cervical radiculitis 02/12/2021   Fatty liver disease, nonalcoholic 08/16/2020   Distal right biceps tendinitis 06/11/2020   Impingement syndrome, shoulder, right 05/18/2020   Snoring 05/18/2020   Muscle strain 04/18/2020   Hyperlipidemia LDL goal <100 09/11/2018   Hypothyroidism 09/11/2018   Elevated liver enzymes 12/09/2012   Seasonal allergies 11/21/2012    Past Surgical History:  Procedure Laterality Date   BACK SURGERY     thumb surgery         Home Medications    Prior to Admission medications   Medication Sig Start Date End Date Taking? Authorizing Provider  atorvastatin  (LIPITOR) 20 MG tablet TAKE 1 TABLET BY MOUTH AT BEDTIME 06/09/23   Adela Holter, DO  KRILL OIL PO Take 500 mg by mouth daily.    [provider]  levothyroxine   (SYNTHROID ) 75 MCG tablet TAKE 1 TABLET BY MOUTH ONCE DAILY BEFORE BREAKFAST 08/18/23   Adela Holter, DO  meloxicam  (MOBIC ) 15 MG tablet One tab PO every 24 hours with a meal for 2 weeks, then once every 24 hours prn pain. 09/15/23   Gean Keels, MD  Multiple Vitamin (MULTI VITAMIN MENS PO) Take 1 tablet by mouth daily.    [provider]  olmesartan  (BENICAR ) 5 MG tablet Take 1 tablet (5 mg total) by mouth daily. 01/09/23   Adela Holter, DO    Family History Family History  Problem Relation Age of Onset   Multiple sclerosis Mother    Obstructive Sleep Apnea Mother    Heart disease Father    Heart attack Father    Thyroid disease Sister    Heart disease Paternal Grandfather    Colon cancer Neg Hx    Esophageal cancer Neg Hx    Stomach cancer Neg Hx    Rectal cancer Neg Hx     Social History Social History   Tobacco Use   Smoking status: Never   Smokeless tobacco: Never  Vaping Use   Vaping status: Never Used  Substance Use Topics   Alcohol use: Yes    Alcohol/week: 4.0 standard drinks of alcohol    Types: 2 Cans of beer, 2 Shots of liquor per week  Drug use: Never     Allergies   Patient has no known allergies.   Review of Systems Review of Systems See HPI for relevant ROS.  Physical Exam Triage Vital Signs ED Triage Vitals  Encounter Vitals Group     BP 12/08/23 1620 133/80     Systolic BP Percentile --      Diastolic BP Percentile --      Pulse Rate 12/08/23 1620 65     Resp 12/08/23 1620 17     Temp 12/08/23 1620 98.3 F (36.8 C)     Temp Source 12/08/23 1620 Oral     SpO2 12/08/23 1620 95 %     Weight --      Height --      Head Circumference --      Peak Flow --      Pain Score 12/08/23 1621 0     Pain Loc --      Pain Education --      Exclude from Growth Chart --    No data found.  Updated Vital Signs BP 133/80 (BP Location: Left Arm)   Pulse 65   Temp 98.3 F (36.8 C) (Oral)   Resp 17   SpO2 95%   Visual  Acuity Right Eye Distance:   Left Eye Distance:   Bilateral Distance:    Right Eye Near:   Left Eye Near:    Bilateral Near:     Physical Exam General: Alert and oriented, well-developed/well-nourished, calm, cooperative, no acute distress HEENT: 2.5 cm laceration of the scalp, moist mucous membranes, no scleral icterus, trachea midline, PERRL Lungs: Speaking full sentences, non-labored respirations, no distress Heart: Regular rate and rhythm Abdomen:  Soft, nondistended Musculoskeletal: Moves all extremities well Neurologic: Awake, A&O x4, gait normal Integumentary: Warm, dry, normal for ethnicity, 2.5 cm laceration of the scalp Psychiatric: Appropriate mood & affect  UC Treatments / Results  Labs (all labs ordered are listed, but only abnormal results are displayed) Labs Reviewed - No data to display  EKG   Radiology No results found.  Procedures Procedures (including critical care time) Procedure: Laceration Repair Location: Scalp Indications/Consent: Patient identified using full name and DOB. Verbal consent was obtained by the patient. Risks discussed included bleeding, infection, pain, and scarring. Benefits included improved hemostasis of wound, improved healing time of wound, and preventing excess scarring to ensure optimal return of full function of affected area. Alternatives discussed included leaving the wound to heal by secondary intent. Patient stated understanding of the procedure being performed. The patient did not have any questions.  Indication: Wound closure Description: The appropriate timeout was taken. Laceration was copiously irrigated.  Discussed injecting lidocaine versus just placing staples and patient preferred to just have the laceration closed with staples.  Closure was performed with 4 staples.  Complications: There were no complications during this procedure. Estimated blood loss was less than 0.5 mL. Disposition: Patient tolerated overall  procedure well. No immediate complications noted. Discussed with patient return precautions to include: fever, erythema, swelling, pain, or purulent discharge from the wound. Post-procedure care of wound discussed. Patient instructed to have stable removal in 7-10 days.  Medications Ordered in UC Medications - No data to display  Initial Impression / Assessment and Plan / UC Course  I have reviewed the triage vital signs and the nursing notes.  Pertinent labs & imaging results that were available during my care of the patient were reviewed by me and considered in my medical decision making (  see chart for details).    Patient presents with laceration.  Differential Diagnosis: Laceration, infection, ICH, hematoma, hemorrhage, including other diagnoses.  Rationale: Wound inspected under direct bright light with good visualization. Area with linear laceration. No overt foreign body. Area hemostatic.  Area extensively irrigated under pressure.  Please see procedure note for more details on the laceration repair. Patient tolerated procedure well. Strict return precautions discussed including loss of sensation, fever, purulent drainage from the wound, redness, swelling, pain out of proportion, or if patient has any other concerns.  Patient should follow-up with their PCP in the next several days.  Discussed wound care and staple removal time.  Lastly, patient's last tetanus was in 2018 so he is up-to-date.  Disposition: Stable to discharge home.  All questions answered to the best of this examiner's ability. Advised to f/u with PCP for further eval and/or reassessment. Patient agrees to plan.  An appropriate evaluation has been performed, and in my medical judgment there is currently no evidence of an immediate life-threatening or surgical condition. Discharge is therefore indicated at this time.  This document was created using the aid of voice recognition Scientist, clinical (histocompatibility and immunogenetics).   Final  Clinical Impressions(s) / UC Diagnoses   Final diagnoses:  Scalp laceration, initial encounter     Discharge Instructions      Please return in 7 to 10 days for staple removal.  Please return before then if you notice any signs of infection such as fever, pus drainage, significant pain, significant redness and swelling, or if you have any other concerns.  You can take Tylenol and ibuprofen as needed for pain relief.  ED Prescriptions   None    PDMP not reviewed this encounter.   Edna Gouty, PA-C 12/08/23 1653

## 2023-12-08 NOTE — Discharge Instructions (Signed)
 Please return in 7 to 10 days for staple removal.  Please return before then if you notice any signs of infection such as fever, pus drainage, significant pain, significant redness and swelling, or if you have any other concerns.  You can take Tylenol and ibuprofen as needed for pain relief.

## 2023-12-08 NOTE — ED Triage Notes (Addendum)
 Pt scalp laceration that occurred abour 1030 this am. Says he was getting into a van when he mistook the height, lacerating his head. Bleeding controlled. Last tdap 01/2017

## 2023-12-17 ENCOUNTER — Ambulatory Visit: Admission: EM | Admit: 2023-12-17 | Discharge: 2023-12-17 | Disposition: A | Discharge status: Home / Self Care

## 2023-12-17 DIAGNOSIS — Z4802 Encounter for removal of sutures: Secondary | ICD-10-CM | POA: Diagnosis not present

## 2023-12-17 NOTE — ED Triage Notes (Signed)
 Staple removal

## 2024-01-11 ENCOUNTER — Encounter: Payer: 59 | Admitting: Family Medicine

## 2024-01-22 ENCOUNTER — Ambulatory Visit: Admitting: Family Medicine

## 2024-01-26 ENCOUNTER — Encounter: Payer: Self-pay | Admitting: Family Medicine

## 2024-01-26 ENCOUNTER — Ambulatory Visit (INDEPENDENT_AMBULATORY_CARE_PROVIDER_SITE_OTHER): Admitting: Family Medicine

## 2024-01-26 ENCOUNTER — Ambulatory Visit (INDEPENDENT_AMBULATORY_CARE_PROVIDER_SITE_OTHER): Payer: Self-pay

## 2024-01-26 VITALS — BP 123/84 | HR 57 | Ht 69.0 in | Wt 242.0 lb

## 2024-01-26 DIAGNOSIS — E039 Hypothyroidism, unspecified: Secondary | ICD-10-CM

## 2024-01-26 DIAGNOSIS — Z0000 Encounter for general adult medical examination without abnormal findings: Secondary | ICD-10-CM

## 2024-01-26 DIAGNOSIS — I10 Essential (primary) hypertension: Secondary | ICD-10-CM

## 2024-01-26 DIAGNOSIS — K760 Fatty (change of) liver, not elsewhere classified: Secondary | ICD-10-CM | POA: Diagnosis not present

## 2024-01-26 DIAGNOSIS — E785 Hyperlipidemia, unspecified: Secondary | ICD-10-CM

## 2024-01-26 DIAGNOSIS — Z114 Encounter for screening for human immunodeficiency virus [HIV]: Secondary | ICD-10-CM | POA: Diagnosis not present

## 2024-01-26 DIAGNOSIS — Z125 Encounter for screening for malignant neoplasm of prostate: Secondary | ICD-10-CM

## 2024-01-26 NOTE — Patient Instructions (Signed)

## 2024-01-26 NOTE — Progress Notes (Signed)
 Brandon Williams - 53 y.o. male MRN [id number]  Date of birth: April 16, 1971  Subjective Chief Complaint  Patient presents with   Annual Exam    HPI Brandon Williams is a 53 y.o. male here today for annual exam.   He reports that he is doing pretty well.   He is moderately active.  He feels that diet is pretty good.   He does not use tobacco products.  Occasional EtOH use.   He does have regular dental care.   Review of Systems  Constitutional:  Negative for chills, fever, malaise/fatigue and weight loss.  HENT:  Negative for congestion, ear pain and sore throat.   Eyes:  Negative for blurred vision, double vision and pain.  Respiratory:  Negative for cough and shortness of breath.   Cardiovascular:  Negative for chest pain and palpitations.  Gastrointestinal:  Negative for abdominal pain, blood in stool, constipation, heartburn and nausea.  Genitourinary:  Negative for dysuria and urgency.  Musculoskeletal:  Negative for joint pain and myalgias.  Neurological:  Negative for dizziness and headaches.  Endo/Heme/Allergies:  Does not bruise/bleed easily.  Psychiatric/Behavioral:  Negative for depression. The patient is not nervous/anxious and does not have insomnia.     No Known Allergies  Past Medical History:  Diagnosis Date   Allergic rhinitis    Allergy    Family history of malignant hyperthermia    paternal grandmother and paternal uncle died in OR from [facility]   Hyperlipidemia    Thyroid disease     Past Surgical History:  Procedure Laterality Date   BACK SURGERY     thumb surgery      Social History   Socioeconomic History   Marital status: Married    Spouse name: Not on file   Number of children: Not on file   Years of education: Not on file   Highest education level: Not on file  Occupational History   Not on file  Tobacco Use   Smoking status: Never   Smokeless tobacco: Never  Vaping Use   Vaping status: Never Used  Substance and Sexual Activity    Alcohol use: Yes    Alcohol/week: 4.0 standard drinks of alcohol    Types: 2 Cans of beer, 2 Shots of liquor per week   Drug use: Never   Sexual activity: Yes  Other Topics Concern   Not on file  Social History Narrative   Caffiene 1 cup coffee in am,  soda /tea. Education: HS some college.  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    Social Drivers of Corporate investment banker Strain: Low Risk  (01/23/2024)   Overall Financial Resource Strain (CARDIA)    Difficulty of Paying Living Expenses: Not hard at all  Food Insecurity: No Food Insecurity (01/23/2024)   Hunger Vital Sign    Worried About Running Out of Food in the Last Year: Never true    Ran Out of Food in the Last Year: Never true  Transportation Needs: No Transportation Needs (01/23/2024)   PRAPARE - Administrator, Civil Service (Medical): No    Lack of Transportation (Non-Medical): No  Physical Activity: Insufficiently Active (01/23/2024)   Exercise Vital Sign    Days of Exercise per Week: 1 day    Minutes of Exercise per Session: 60 min  Stress: No Stress Concern Present (01/23/2024)   [employer] of Occupational Health - Occupational Stress Questionnaire    Feeling of Stress: Not at all  Social  Connections: Moderately Integrated (01/23/2024)   Social Connection and Isolation Panel    Frequency of Communication with Friends and Family: More than three times a week    Frequency of Social Gatherings with Friends and Family: Once a week    Attends Religious Services: Patient declined    Database administrator or Organizations: Yes    Attends Engineer, structural: 1 to 4 times per year    Marital Status: Married    Family History  Problem Relation Age of Onset   Multiple sclerosis Mother    Obstructive Sleep Apnea Mother    Heart disease Father    Heart attack Father    Thyroid disease Sister    Heart disease Paternal Grandfather    Colon cancer Neg Hx    Esophageal cancer Neg Hx     Stomach cancer Neg Hx    Rectal cancer Neg Hx     Health Maintenance  Topic Date Due   HIV Screening  Never done   Hepatitis B Vaccines (1 of 3 - 19+ 3-dose series) Never done   Zoster Vaccines- Shingrix (1 of 2) Never done   COVID-19 Vaccine (4 - 2024-25 season) 03/08/2023   INFLUENZA VACCINE  02/05/2024   Colonoscopy  03/08/2025   DTaP/Tdap/Td (2 - Td or Tdap) 01/22/2027   Hepatitis C Screening  Completed   HPV VACCINES  Aged Out   Meningococcal B Vaccine  Aged Out     ----------------------------------------------------------------------------------------------------------------------------------------------------------------------------------------------------------------- Physical Exam BP 123/84 (BP Location: Right Arm, Patient Position: Sitting, Cuff Size: Large)   Pulse (!) 57   Ht 5' 9 (1.753 m)   Wt 242 lb (109.8 kg)   SpO2 97%   BMI 35.74 kg/m   Physical Exam Constitutional:      General: [name]. HENT:     Head: Normocephalic and atraumatic.     Right Ear: Tympanic membrane and external ear normal.     Left Ear: Tympanic membrane and external ear normal.  Eyes:     General: No scleral icterus. Neck:     Thyroid: No thyromegaly.  Cardiovascular:     Rate and Rhythm: Normal rate and regular rhythm.     Heart sounds: Normal heart sounds.  Pulmonary:     Effort: Pulmonary effort is normal.     Breath sounds: Normal breath sounds.  Abdominal:     General: Bowel sounds are normal. There is no distension.     Palpations: Abdomen is soft.     Tenderness: There is no abdominal tenderness. There is no guarding.  Musculoskeletal:     Cervical back: Normal range of motion.  Lymphadenopathy:     Cervical: No cervical adenopathy.  Skin:    General: Skin is warm and dry.     Findings: No rash.  Neurological:     Mental Status: [name], place, and time.     Cranial Nerves: No cranial nerve deficit.     Motor: No  abnormal muscle tone.  Psychiatric:        Mood and Affect: Mood normal.        Behavior: Behavior normal.     ------------------------------------------------------------------------------------------------------------------------------------------------------------------------------------------------------------------- Assessment and Plan  Well adult exam Well adult Orders Placed This Encounter  Procedures   CT CARDIAC SCORING (SELF PAY ONLY)    Standing Status:   Future    Expiration Date:   01/25/2025    Preferred imaging location?:   [facility]  CMP14+EGFR   Lipid panel   HgB A1c   PSA   CBC with Differential   HIV antibody (with reflex)   TSH + free T4    Immunizations: Declines Shingrix Screenings: Up-to-date Anticipatory guidance/risk factor reduction: Recommendations per AVS   No orders of the defined types were placed in this encounter.   No follow-ups on file.

## 2024-01-26 NOTE — Assessment & Plan Note (Signed)
 Well adult Orders Placed This Encounter  Procedures   CT CARDIAC SCORING (SELF PAY ONLY)    Standing Status:   Future    Expiration Date:   01/25/2025    Preferred imaging location?:   [facility]   CMP14+EGFR   Lipid panel   HgB A1c   PSA   CBC with Differential   HIV antibody (with reflex)   TSH + free T4    Immunizations: Declines Shingrix Screenings: Up-to-date Anticipatory guidance/risk factor reduction: Recommendations per AVS

## 2024-01-27 LAB — CBC WITH DIFFERENTIAL/PLATELET
Basophils Absolute: 0 x10E3/uL (ref 0.0–0.2)
Basos: 0 %
EOS (ABSOLUTE): 0.1 x10E3/uL (ref 0.0–0.4)
Eos: 1 %
Hematocrit: 46.8 % (ref 37.5–51.0)
Hemoglobin: 15.2 g/dL (ref 13.0–17.7)
Immature Grans (Abs): 0 x10E3/uL (ref 0.0–0.1)
Immature Granulocytes: 0 %
Lymphocytes Absolute: 1.6 x10E3/uL (ref 0.7–3.1)
Lymphs: 32 %
MCH: 31.9 pg (ref 26.6–33.0)
MCHC: 32.5 g/dL (ref 31.5–35.7)
MCV: 98 fL — ABNORMAL HIGH (ref 79–97)
Monocytes Absolute: 0.5 x10E3/uL (ref 0.1–0.9)
Monocytes: 10 %
Neutrophils Absolute: 2.9 x10E3/uL (ref 1.4–7.0)
Neutrophils: 57 %
Platelets: 186 x10E3/uL (ref 150–450)
RBC: 4.76 x10E6/uL (ref 4.14–5.80)
RDW: 12.4 % (ref 11.6–15.4)
WBC: 5.1 x10E3/uL (ref 3.4–10.8)

## 2024-01-27 LAB — CMP14+EGFR
ALT: 57 IU/L — ABNORMAL HIGH (ref 0–44)
AST: 47 IU/L — ABNORMAL HIGH (ref 0–40)
Albumin: 4.7 g/dL (ref 3.8–4.9)
Alkaline Phosphatase: 60 IU/L (ref 44–121)
BUN/Creatinine Ratio: 14 (ref 9–20)
BUN: 14 mg/dL (ref 6–24)
Bilirubin Total: 0.5 mg/dL (ref 0.0–1.2)
CO2: 21 mmol/L (ref 20–29)
Calcium: 9 mg/dL (ref 8.7–10.2)
Chloride: 102 mmol/L (ref 96–106)
Creatinine, Ser: 0.98 mg/dL (ref 0.76–1.27)
Globulin, Total: 2.3 g/dL (ref 1.5–4.5)
Glucose: 102 mg/dL — ABNORMAL HIGH (ref 70–99)
Potassium: 4.7 mmol/L (ref 3.5–5.2)
Sodium: 139 mmol/L (ref 134–144)
Total Protein: 7 g/dL (ref 6.0–8.5)
eGFR: 93 mL/min/1.73 (ref >59)

## 2024-01-27 LAB — LIPID PANEL
Chol/HDL Ratio: 4.4 ratio (ref 0.0–5.0)
Cholesterol, Total: 162 mg/dL (ref 100–199)
HDL: 37 mg/dL — ABNORMAL LOW (ref >39)
LDL Chol Calc (NIH): 92 mg/dL (ref 0–99)
Triglycerides: 194 mg/dL — ABNORMAL HIGH (ref 0–149)
VLDL Cholesterol Cal: 33 mg/dL (ref 5–40)

## 2024-01-27 LAB — HEMOGLOBIN A1C
Est. average glucose Bld gHb Est-mCnc: 114 mg/dL
Hgb A1c MFr Bld: 5.6 % (ref 4.8–5.6)

## 2024-01-27 LAB — TSH+FREE T4
Free T4: 1.19 ng/dL (ref 0.82–1.77)
TSH: 2.79 u[IU]/mL (ref 0.450–4.500)

## 2024-01-27 LAB — PSA: Prostate Specific Ag, Serum: 1.7 ng/mL (ref 0.0–4.0)

## 2024-01-27 LAB — HIV ANTIBODY (ROUTINE TESTING W REFLEX): HIV Screen 4th Generation wRfx: NONREACTIVE

## 2024-01-29 ENCOUNTER — Ambulatory Visit: Payer: Self-pay | Admitting: Family Medicine

## 2024-01-29 DIAGNOSIS — R931 Abnormal findings on diagnostic imaging of heart and coronary circulation: Secondary | ICD-10-CM

## 2024-01-29 DIAGNOSIS — E785 Hyperlipidemia, unspecified: Secondary | ICD-10-CM

## 2024-02-01 ENCOUNTER — Other Ambulatory Visit: Payer: Self-pay | Admitting: Family Medicine

## 2024-02-01 DIAGNOSIS — E785 Hyperlipidemia, unspecified: Secondary | ICD-10-CM

## 2024-02-01 MED ORDER — ATORVASTATIN CALCIUM 40 MG PO TABS: 40.0000 mg | ORAL_TABLET | Freq: Every day | ORAL | 1 refills | Status: DC

## 2024-02-04 NOTE — Progress Notes (Signed)
 Brandon Williams, I am covering for Dr. Alvia while he is out of the office.  Just wanted to let you know that your liver enzymes are mildly elevated but similar to what they have been over the last year.  Your triglycerides do look better so great work in improving those numbers.  I know Dr. Alvia that increased your atorvastatin  to try to get your LDL down and I think that is a great idea especially with your recent calcium  score on your heart.  Your blood count is normal.  A1c looks great at 5.6.  Prostate test is normal.  Thyroid looks great.  Negative for HIV.

## 2024-02-24 ENCOUNTER — Institutional Professional Consult (permissible substitution) (HOSPITAL_BASED_OUTPATIENT_CLINIC_OR_DEPARTMENT_OTHER): Admitting: Nurse Practitioner

## 2024-03-08 ENCOUNTER — Encounter: Payer: Self-pay | Admitting: Sports Medicine

## 2024-03-09 ENCOUNTER — Ambulatory Visit (HOSPITAL_BASED_OUTPATIENT_CLINIC_OR_DEPARTMENT_OTHER): Admitting: Nurse Practitioner

## 2024-03-09 ENCOUNTER — Encounter (HOSPITAL_BASED_OUTPATIENT_CLINIC_OR_DEPARTMENT_OTHER): Payer: Self-pay | Admitting: Nurse Practitioner

## 2024-03-09 VITALS — BP 126/82 | HR 62 | Ht 69.0 in | Wt 239.5 lb

## 2024-03-09 DIAGNOSIS — I10 Essential (primary) hypertension: Secondary | ICD-10-CM | POA: Diagnosis not present

## 2024-03-09 DIAGNOSIS — R748 Abnormal levels of other serum enzymes: Secondary | ICD-10-CM | POA: Diagnosis not present

## 2024-03-09 DIAGNOSIS — Z79899 Other long term (current) drug therapy: Secondary | ICD-10-CM

## 2024-03-09 DIAGNOSIS — E785 Hyperlipidemia, unspecified: Secondary | ICD-10-CM

## 2024-03-09 DIAGNOSIS — R931 Abnormal findings on diagnostic imaging of heart and coronary circulation: Secondary | ICD-10-CM | POA: Diagnosis not present

## 2024-03-09 DIAGNOSIS — K760 Fatty (change of) liver, not elsewhere classified: Secondary | ICD-10-CM

## 2024-03-09 DIAGNOSIS — Z7189 Other specified counseling: Secondary | ICD-10-CM

## 2024-03-09 NOTE — Patient Instructions (Addendum)
 Medication Instructions:   Your physician recommends that you continue on your current medications as directed. Please refer to the Current Medication list given to you today.  *If you need a refill on your cardiac medications before your next appointment, please call your pharmacy*   Lab Work:  IN 2-3 MONTHS HERE AT OUR [facility] ON THE 3RD FLOOR [address]--NMR LIPOPROFILE, LIPOPROTEIN A, AND ALT--PLEASE COME FASTING TO THIS LAB APPOINTMENT  If you have labs (blood work) drawn today and your tests are completely normal, you will receive your results only by: MyChart Message (if you have MyChart) OR A paper copy in the mail If you have any lab test that is abnormal or we need to change your treatment, we will call you to review the results.    Follow-Up:  3 MONTHS WITH ROSALINE BANE, NP HERE IN [facility]--PLEASE HAVE YOUR LABS COMPLETED BEFORE THIS VISIT   Other Instructions  Adopting a Healthy Lifestyle.   Weight: Know what a healthy weight is for you (roughly BMI <25) and aim to maintain this. You can calculate your body mass index on your smart phone. Unfortunately, this is not the most accurate measure of healthy weight, but it is the simplest measurement to use. A more accurate measurement involves body scanning which measures lean muscle, fat tissue and bony density. We do not have this equipment at [facility].    Diet: Aim for 7+ servings of fruits and vegetables daily Limit animal fats in diet for cholesterol and heart health - choose grass fed whenever available Avoid highly processed foods (fast food burgers, tacos, fried chicken, pizza, hot dogs, french fries)  Saturated fat comes in the form of butter, lard, coconut oil, margarine, partially hydrogenated oils, and fat in meat. These increase your risk of cardiovascular disease.  Use healthy plant oils, such as olive, canola, soy, corn, sunflower and peanut.  Whole foods such as fruits, vegetables and whole grains have  fiber  Men need > 38 grams of fiber per day Women need > 25 grams of fiber per day  Load up on vegetables and fruits - one-half of your plate: Aim for color and variety, and remember that potatoes dont count. Go for whole grains - one-quarter of your plate: Whole wheat, barley, wheat berries, quinoa, oats, brown rice, and foods made with them. If you want pasta, go with whole wheat pasta. Protein power - one-quarter of your plate: Fish, chicken, beans, and nuts are all healthy, versatile protein sources. Limit red meat. You need carbohydrates for energy! The type of carbohydrate is more important than the amount. Choose carbohydrates such as vegetables, fruits, whole grains, beans, and nuts in the place of white rice, white pasta, potatoes (baked or fried), macaroni and cheese, cakes, cookies, and donuts.  If youre thirsty, drink water. Coffee and tea are good in moderation, but skip sugary drinks and limit milk and dairy products to one or two daily servings. Keep sugar intake at 6 teaspoons or 24 grams or LESS       Exercise: Aim for 150 min of moderate intensity exercise weekly for heart health, and weights twice weekly for bone health Stay active - any steps are better than no steps! Aim for 7-9 hours of sleep daily      Mediterranean Diet A Mediterranean diet is based on the traditions of countries on the [employer]. It focuses on eating more: Fruits and vegetables. Whole grains, beans, nuts, and seeds. Heart-healthy fats. These are fats that are  good for your heart. It involves eating less: Dairy. Meat and eggs. Processed foods with added sugar, salt, and fat. This type of diet can help prevent certain conditions. It can also improve outcomes if you have a long-term (chronic) disease, such as kidney or heart disease. What are tips for following this plan? Reading food labels Check packaged foods for: The serving size. For foods such as rice and pasta, the serving size is  the amount of cooked product, not dry. The total fat. Avoid foods with saturated fat or trans fat. Added sugars, such as corn syrup. Shopping  Try to have a balanced diet. Buy a variety of foods, such as: Fresh fruits and vegetables. You may be able to get these from local farmers markets. You can also buy them frozen. Grains, beans, nuts, and seeds. Some of these can be bought in bulk. Fresh seafood. Poultry and eggs. Low-fat dairy products. Buy whole ingredients instead of foods that have already been packaged. If you can't get fresh seafood, buy precooked frozen shrimp or canned fish, such as tuna, salmon, or sardines. Stock your pantry so you always have certain foods on hand, such as olive oil, canned tuna, canned tomatoes, rice, pasta, and beans. Cooking Cook foods with extra-virgin olive oil instead of using butter or other vegetable oils. Have meat as a side dish. Have vegetables or grains as your main dish. This means having meat in small portions or adding small amounts of meat to foods like pasta or stew. Use beans or vegetables instead of meat in common dishes like chili or lasagna. Try out different cooking methods. Try roasting, broiling, steaming, and sauting vegetables. Add frozen vegetables to soups, stews, pasta, or rice. Add nuts or seeds for added healthy fats and plant protein at each meal. You can add these to yogurt, salads, or vegetable dishes. Marinate fish or vegetables using olive oil, lemon juice, garlic, and fresh herbs. Meal planning Plan to eat a vegetarian meal one day each week. Try to work up to two vegetarian meals, if possible. Eat seafood two or more times a week. Have healthy snacks on hand. These may include: Vegetable sticks with hummus. Greek yogurt. Fruit and nut trail mix. Eat balanced meals. These should include: Fruit: 2-3 servings a day. Vegetables: 4-5 servings a day. Low-fat dairy: 2 servings a day. Fish, poultry, or lean meat: 1  serving a day. Beans and legumes: 2 or more servings a week. Nuts and seeds: 1-2 servings a day. Whole grains: 6-8 servings a day. Extra-virgin olive oil: 3-4 servings a day. Limit red meat and sweets to just a few servings a month. Lifestyle  Try to cook and eat meals with your family. Drink enough fluid to keep your pee (urine) pale yellow. Be active every day. This includes: Aerobic exercise, which is exercise that causes your heart to beat faster. Examples include running and swimming. Leisure activities like gardening, walking, or housework. Get 7-8 hours of sleep each night. Drink red wine if your provider says you can. A glass of wine is 5 oz (150 mL). You may be allowed to have: Up to 1 glass a day if you're male and not pregnant. Up to 2 glasses a day if you're male. What foods should I eat? Fruits Apples. Apricots. Avocado. Berries. Bananas. Cherries. Dates. Figs. Grapes. Lemons. Melon. Oranges. Peaches. Plums. Pomegranate. Vegetables Artichokes. Beets. Broccoli. Cabbage. Carrots. Eggplant. Green beans. Chard. Kale. Spinach. Onions. Leeks. Peas. Squash. Tomatoes. Peppers. Radishes. Grains Whole-grain pasta. Brown rice.  Bulgur wheat. Polenta. Couscous. Whole-wheat bread. [place] Madeira. Meats and other proteins Beans. Almonds. Sunflower seeds. Pine nuts. Peanuts. Cod. Salmon. Scallops. Shrimp. Tuna. Tilapia. Clams. Oysters. Eggs. Chicken or [place] without skin. Dairy Low-fat milk. Cheese. Greek yogurt. Fats and oils Extra-virgin olive oil. Avocado oil. Grapeseed oil. Beverages Water. Red wine. Herbal tea. Sweets and desserts Greek yogurt with honey. Baked apples. Poached pears. Trail mix. Seasonings and condiments Basil. Cilantro. Coriander. Cumin. Mint. Parsley. Sage. Rosemary. Tarragon. Garlic. Oregano. Thyme. Pepper. Balsamic vinegar. Tahini. Hummus. Tomato sauce. Olives. Mushrooms. The items listed above may not be all the foods and drinks you can have. Talk to a  dietitian to learn more. What foods should I limit? This is a list of foods that should be eaten rarely. Fruits Fruit canned in syrup. Vegetables Deep-fried potatoes, like [place] fries. Grains Packaged pasta or rice dishes. Cereal with added sugar. Snacks with added sugar. Meats and other proteins Beef. Pork. Lamb. Chicken or [place] with skin. Hot dogs. [place]. Dairy Ice cream. Sour cream. Whole milk. Fats and oils Butter. Canola oil. Vegetable oil. Beef fat (tallow). Lard. Beverages Juice. Sugar-sweetened soft drinks. Beer. Liquor and spirits. Sweets and desserts Cookies. Cakes. Pies. Candy. Seasonings and condiments Mayonnaise. Pre-made sauces and marinades. The items listed above may not be all the foods and drinks you should limit. Talk to a dietitian to learn more. Where to find more information American Heart Association (AHA): heart.org This information is not intended to replace advice given to you by your health care provider. Make sure you discuss any questions you have with your health care provider. Document Revised: 10/05/2022 Document Reviewed: 10/05/2022 Elsevier Patient Education  2024 [employer].

## 2024-03-09 NOTE — Progress Notes (Signed)
 " Cardiology Office Note:  .   Date:  03/10/2024 ID:  Brandon Williams, DOB May 21, 1971, MRN [id number] PCP: Alvia Bring, DO [facility] Providers Cardiologist:  None   Patient Profile: .      PMH Coronary artery calcification CT Calcium  score 01/26/2024 CAC Score 81.4 (83rd percentile) LM 35.1, LAD 6.48, LCx 17.3, RCA 22.5 Dyslipidemia Hypothyroidism Hypertension Transaminitis OSA on CPAP       History of Present Illness: .   Discussed the use of AI scribe software for clinical note transcription with the patient, who gave verbal consent to proceed.  History of Present Illness Brandon Williams is a very pleasant 53 year old male who is here as a new patient for evaluation for elevated cardiac calcium  score.  He has history of hyperlipidemia and hypertension, particularly elevated triglycerides.  His atorvastatin  was recently increased from 20 mg daily to 40 mg daily. His father had a heart attack at 48, requiring stents and a defibrillator. He is on olmesartan  for hypertension, generally well-controlled, experiencing flushed ears as a side effect. He has sleep apnea and uses a CPAP machine.  Since having the CT calcium  score, he increased his physical activity, walking 33 miles last month. He and his wife are cooking more at home and use a meal prep service for healthier eating.  He denies chest pain, shortness of breath, palpitations, orthopnea, PND, edema, presyncope, syncope. Heart rate is usually around 62 bpm at rest.   Family history: His family history includes Heart attack in his father; Heart disease in his father and paternal grandfather; Multiple sclerosis in his mother; Obstructive Sleep Apnea in his mother; Thyroid disease in his sister.  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 Has increased walking recently   No results found for: LIPOA    ROS: See HPI       Studies Reviewed: SABRA   EKG Interpretation Date/Time:  Wednesday March 09 2024 14:49:52 EDT Ventricular Rate:  62 PR Interval:  192 QRS Duration:  90 QT Interval:  408 QTC Calculation: 414 R Axis:   18  Text Interpretation: Normal sinus rhythm Normal ECG No previous ECGs available Confirmed by Percy Browning [phone]) on 03/09/2024 2:53:15 PM      Risk Assessment/Calculations:             Physical Exam:   VS: BP 126/82 (BP Location: Left Arm, Patient Position: Sitting, Cuff Size: Normal)   Pulse 62   Ht 5' 9 (1.753 m)   Wt 239 lb 8 oz (108.6 kg)   SpO2 92%   BMI 35.37 kg/m   Wt Readings from Last 3 Encounters:  03/09/24 239 lb 8 oz (108.6 kg)  01/26/24 242 lb (109.8 kg)  07/13/23 236 lb (107 kg)     GEN:  Well nourished, well developed in no acute distress NECK: No JVD; No carotid bruits CARDIAC: hnRRR, no murmurs, rubs, gallops RESPIRATORY:  Clear to auscultation without rales, wheezing or rhonchi  ABDOMEN: Soft, non-tender, non-distended EXTREMITIES:  No edema; No deformity     ASSESSMENT AND PLAN: .    Assessment & Plan Coronary artery calcification Cardiac risk    CT calcium  score of 81.4 placing him in an 84th percentile for age/sex matched controls with distribution in all four main coronary arteries.  ASCVD risk score is 5.4%, includes history of hypertension and hyperlipidemia.  No history of smoking or diabetes. Family history is significant for his father who had widow maker MI which was treated with stenting and  AICD.  He is now doing well.  Patient is walking for exercise and denies chest pain, shortness of breath, or other symptoms concerning for angina.  EKG reveals NSR with no ST/T abnormality.  We discussed potential ischemia evaluation in the future.  He agrees to hold off for now and will notify us  if he develops concerning symptoms with exertion.  He has been working on lifestyle modification and we discussed ways to further enhance this.  We will recheck lipids after 2 to 3 months of therapy on higher dose of atorvastatin . - Consider starting aspirin 81 mg daily - Recheck lipids in 2 to 3 months including lipoprotein a and ALT for surveillance on high-dose atorvastatin  - Continue heart healthy, mostly plant-based diet avoiding processed foods, saturated fat, sugar, and other simple carbohydrates - Aim for at least 150 minutes of moderate intensity exercise each week along with generally physically active lifestyle - Aim to incorporate weight lifting and resistance training for 20 to 30 minutes at least 3 days a week  Hyperlipidemia LDL goal < 70 Elevated liver enzymes Fatty liver Obesity Lipid panel completed 01/26/2024 with total cholesterol 162, triglycerides 194, HDL 37, and LDL-C 92.  His atorvastatin  was increased from 20 mg daily to 40 mg daily following CT calcium  score. He has already made dietary modifications due to elevated Ca score. We had a lengthy discussion about how lipids are affected by diet and exercise and how diet affects overall metabolic health.  LDL was previously > 100 mg/dL he is already made improvements on lower dose atorvastatin . -Continue atorvastatin  40 mg daily -Dietary modifications were emphasized including whole food, Mediterranean style diet avoiding saturated fat, processed foods, sugar, and other simple carbohydrates -Maintain a physically active lifestyle aiming for at least 150 minutes of moderate intensity exercise each week  Essential hypertension   BP is  well-controlled.  He notes occasional ear flushing which is attributed to increased blood flow, but no acute concerns.  Renal function stable on labs completed 01/26/2024. -Continue olmesartan  for blood pressure management.     Dispo: 3 months with me  Signed, Rosaline Bane, NP-C "

## 2024-03-10 ENCOUNTER — Encounter (HOSPITAL_BASED_OUTPATIENT_CLINIC_OR_DEPARTMENT_OTHER): Payer: Self-pay | Admitting: Nurse Practitioner

## 2024-03-26 ENCOUNTER — Other Ambulatory Visit: Payer: Self-pay | Admitting: Family Medicine

## 2024-03-26 DIAGNOSIS — I10 Essential (primary) hypertension: Secondary | ICD-10-CM

## 2024-04-11 ENCOUNTER — Institutional Professional Consult (permissible substitution) (HOSPITAL_BASED_OUTPATIENT_CLINIC_OR_DEPARTMENT_OTHER): Admitting: Nurse Practitioner

## 2024-05-17 LAB — ALT: ALT: 67 [IU]/L — ABNORMAL HIGH (ref 0–44)

## 2024-05-18 ENCOUNTER — Ambulatory Visit: Payer: Self-pay | Admitting: Nurse Practitioner

## 2024-05-18 LAB — NMR, LIPOPROFILE
Cholesterol, Total: 143 mg/dL (ref 100–199)
HDL Particle Number: 31.3 umol/L (ref >=30.5)
HDL-C: 31 mg/dL — ABNORMAL LOW (ref >39)
LDL Particle Number: 884 nmol/L (ref <1000)
LDL Size: 19.8 nm — ABNORMAL LOW (ref >20.5)
LDL-C (NIH Calc): 77 mg/dL (ref 0–99)
LP-IR Score: 71 — ABNORMAL HIGH (ref <=45)
Small LDL Particle Number: 584 nmol/L — ABNORMAL HIGH (ref <=527)
Triglycerides: 209 mg/dL — ABNORMAL HIGH (ref 0–149)

## 2024-05-18 LAB — LIPOPROTEIN A (LPA): Lipoprotein (a): 28.8 nmol/L (ref <75.0)

## 2024-06-03 NOTE — Progress Notes (Signed)
 Cardiology Office Note:  .   Date:  06/08/2024 ID:  Brandon Williams, DOB [birth year], MRN [id number] PCP: Alvia Bring, DO [facility] Providers Cardiologist:  None   Patient Profile: .      PMH Coronary artery calcification CT Calcium  score 01/26/2024 CAC Score 81.4 (83rd percentile) LM 35.1, LAD 6.48, LCx 17.3, RCA 22.5 Dyslipidemia Hypothyroidism Hypertension Transaminitis OSA on CPAP  Seen by me on 03/09/24 as new patient for elevated cardiac calcium  score.History of hyperlipidemia and hypertension, particularly elevated triglycerides.  His atorvastatin  had recently been increased from 20 mg daily to 40 mg daily. His father had a heart attack at 16, requiring stents and a defibrillator. On olmesartan  for hypertension, generally well-controlled, experiencing flushed ears as a side effect. History of sleep apnea on CPAP.  Since having the CT calcium  score, he increased his physical activity, walking 33 miles last month. He and his wife are cooking more at home and use a meal prep service for healthier eating. He denied chest pain, shortness of breath, palpitations, orthopnea, PND, edema, presyncope, syncope. Heart rate is usually around 62 bpm at rest. ASCVD risk score 4.6%. Lipid testing revealed LDL of 77, LDL particle number 884, triglycerides 209, small LDL particle number 584. LP(a) is not elevated. ALT elevated at 67. He was asymptomatic and ischemia evaluation was not pursued at the time.        History of Present Illness: .   Discussed the use of AI scribe software for clinical note transcription with the patient, who gave verbal consent to proceed.  History of Present Illness Brandon Williams is a very pleasant 53 year old male who presents for follow-up of CAD. He remains active with regular walking and yard work and denies chest pain, shortness of breath, palpitations, orthopnea, PND, edema, presyncope, syncope. Family history is significant for coronary artery  disease in his father who had total blockage in his left main artery and underwent stenting and AICD placement.  We reviewed results of coronary CTA in detail.  There is concern for calcification in all 4 coronary arteries including left main.  Most recent lipid testing reveals LDL 77 mg/dL, above his 55 mg/dL target, triglycerides 790 mg/dL, and small LDL particle number over 500. ALT is elevated but not high enough to require stopping the statin. Shoulder pain limits weightlifting and circuit training. He is taking aspirin daily and denies bleeding concerns. BP is soft but he denies fatigue, lightheadedness, presyncope or syncope.  No concerns with cardiac medications.  Family history: His family history includes Heart attack in his father; Heart disease in his father and paternal grandfather; Multiple sclerosis in his mother; Obstructive Sleep Apnea in his mother; Thyroid disease in his sister.  MI in father age 103, total blockage in LM, stents and AICD  ASCVD Risk Score: The 10-year ASCVD risk score (Arnett DK, et al., 2019) is: 3%   Values used to calculate the score:     Age: 63 years     Clincally relevant sex: Male     Is Non-Hispanic African American: No     Diabetic: No     Tobacco smoker: No     Systolic Blood Pressure: 98 mmHg     Is BP treated: Yes     HDL Cholesterol: 37 mg/dL     Total Cholesterol: 143 mg/dL    Lipoprotein (a)  Date/Time Value Ref Range Status  05/17/2024 08:40 AM 28.8 <75.0 nmol/L Final    Comment:  Note:  Values greater than or equal to 75.0 nmol/L may        indicate an independent risk factor for CHD,        but must be evaluated with caution when applied        to non-Caucasian populations due to the        influence of genetic factors on Lp(a) across        ethnicities.      ROS: See HPI       Studies Reviewed: .          Risk Assessment/Calculations:             Physical Exam:   VS: BP 98/64 (BP Location: Left Arm, Patient Position:  Sitting, Cuff Size: Large)   Pulse 63   Ht 5' 9 (1.753 m)   Wt 239 lb 14.4 oz (108.8 kg)   SpO2 93%   BMI 35.43 kg/m   Wt Readings from Last 3 Encounters:  06/08/24 239 lb 14.4 oz (108.8 kg)  03/09/24 239 lb 8 oz (108.6 kg)  01/26/24 242 lb (109.8 kg)     GEN: Well nourished, well developed in no acute distress NECK: No JVD; No carotid bruits CARDIAC: hnRRR, no murmurs, rubs, gallops RESPIRATORY:  Clear to auscultation without rales, wheezing or rhonchi  ABDOMEN: Soft, non-tender, non-distended EXTREMITIES:  No edema; No deformity     ASSESSMENT AND PLAN: .    Assessment & Plan Coronary artery disease Cardiac risk    CT calcium  score of 81.4 placing him in an 84th percentile for age/sex matched controls with distribution in all four main coronary arteries, with most significant calcification in the left main. He is asymptomatic but has a family history of CAD and elevated risk based on early development of calcification, along with history of hypertension and hyperlipidemia. Family history is significant for his father who had widow maker MI which was treated with stenting and AICD. He denies chest pain, shortness of breath, or other symptoms concerning for angina, however based on concerning risk factors, we will pursue ischemia evaluation.  He needs additional lipid-lowering therapy as discussed below.  We will pursue PCSK9 inhibitor therapy pending cost. - Coronary CTA for ischemia evaluation and mapping of coronary anatomy - Lopressor 25 mg 2 hours prior to CT - Continue aspirin, atorvastatin , olmesartan  - Continue heart healthy, mostly plant-based diet avoiding processed foods, saturated fat, sugar, and other simple carbohydrates - Aim for at least 150 minutes of moderate intensity exercise each week along with generally physically active lifestyle - Aim to incorporate weight lifting and resistance training for 20 to 30 minutes at least 3 days a week - Start PCSK9i therapy if  not cost prohibitive  Hyperlipidemia LDL goal < 55 Elevated liver enzymes Fatty liver Obesity Lipid panel 05/17/2024 with LDL particle #884, LDL-C 77, HDL-C 31, triglycerides 209, total cholesterol 143, and small LDL-P 584.  Slight improvement in LDL from 92-77.  Triglycerides remain elevated.  ALT increased from 57 in July 2025 to 67. Advised no indication to hold statin at this time.  He is on atorvastatin  40 mg daily which was increased in July 2025.  He is tolerating without concerning side effects.  Advised LDL goal 55 or lower given significant calcification.  Lengthy discussion about additional lipid-lowering therapy and plan to initiate therapy that would be less likely to increase liver enzymes.  He is agreeable to start PCSK9 inhibitor therapy if it is not cost prohibitive.  -Plan  to start Repatha or Praluent every 14 days for additional lipid-lowering therapy pending cost  - Continue atorvastatin  40 mg daily -Dietary modifications were emphasized including whole food, Mediterranean style diet avoiding saturated fat, processed foods, sugar, and other simple carbohydrates -Maintain a physically active lifestyle aiming for at least 150 minutes of moderate intensity exercise each week  Essential hypertension   BP is well-controlled, a little soft.  He is asymptomatic.. We will recheck renal function today in the setting of upcoming CT scan.  No change in antihypertensive therapy today.   -Continue olmesartan  for blood pressure management     Disposition: 6 months with me  Signed, Rosaline Bane, NP-C

## 2024-06-08 ENCOUNTER — Ambulatory Visit (HOSPITAL_BASED_OUTPATIENT_CLINIC_OR_DEPARTMENT_OTHER): Admitting: Nurse Practitioner

## 2024-06-08 ENCOUNTER — Encounter (HOSPITAL_BASED_OUTPATIENT_CLINIC_OR_DEPARTMENT_OTHER): Payer: Self-pay | Admitting: Nurse Practitioner

## 2024-06-08 ENCOUNTER — Other Ambulatory Visit (HOSPITAL_COMMUNITY): Payer: Self-pay

## 2024-06-08 ENCOUNTER — Telehealth: Payer: Self-pay | Admitting: Pharmacy Technician

## 2024-06-08 VITALS — BP 98/64 | HR 63 | Ht 69.0 in | Wt 239.9 lb

## 2024-06-08 DIAGNOSIS — Z7189 Other specified counseling: Secondary | ICD-10-CM

## 2024-06-08 DIAGNOSIS — I2510 Atherosclerotic heart disease of native coronary artery without angina pectoris: Secondary | ICD-10-CM

## 2024-06-08 DIAGNOSIS — R748 Abnormal levels of other serum enzymes: Secondary | ICD-10-CM

## 2024-06-08 DIAGNOSIS — I10 Essential (primary) hypertension: Secondary | ICD-10-CM

## 2024-06-08 DIAGNOSIS — E66812 Obesity, class 2: Secondary | ICD-10-CM

## 2024-06-08 DIAGNOSIS — E785 Hyperlipidemia, unspecified: Secondary | ICD-10-CM

## 2024-06-08 DIAGNOSIS — K760 Fatty (change of) liver, not elsewhere classified: Secondary | ICD-10-CM

## 2024-06-08 DIAGNOSIS — R931 Abnormal findings on diagnostic imaging of heart and coronary circulation: Secondary | ICD-10-CM | POA: Diagnosis not present

## 2024-06-08 MED ORDER — METOPROLOL TARTRATE 25 MG PO TABS: 25.0000 mg | ORAL_TABLET | Freq: Once | ORAL | 0 refills | Status: AC

## 2024-06-08 NOTE — Telephone Encounter (Signed)
 Pharmacy Patient Advocate Encounter  Received notification from [facility] that Prior Authorization for repatha has been APPROVED from 06/08/24 to 06/08/25. Ran test claim, Copay is $30.00. This test claim was processed through [facility]- copay amounts may vary at other pharmacies due to pharmacy/plan contracts, or as the patient moves through the different stages of their insurance plan.   PA #/Case ID/Reference #: [id number]

## 2024-06-08 NOTE — Patient Instructions (Signed)
 Medication Instructions:   Your physician recommends that you continue on your current medications as directed. Please refer to the Current Medication list given to you today.   *If you need a refill on your cardiac medications before your next appointment, please call your pharmacy*  Lab Work:  TODAY!!!! BMET  If you have labs (blood work) drawn today and your tests are completely normal, you will receive your results only by: MyChart Message (if you have MyChart) OR A paper copy in the mail If you have any lab test that is abnormal or we need to change your treatment, we will call you to review the results.  Testing/Procedures:    Your cardiac CT will be scheduled at one of the below locations:   [facility] [address] [place], [place] [address] [phone]  If scheduled at [facility], please arrive 30 minutes early for check-in and test prep.  Please follow these instructions carefully (unless otherwise directed):  An IV will be required for this test and Nitroglycerin will be given.  Hold all erectile dysfunction medications at least 3 days (72 hrs) prior to test. (Ie viagra, cialis, sildenafil, tadalafil, etc)   On the Night Before the Test: Be sure to Drink plenty of water. Do not consume any caffeinated/decaffeinated beverages or chocolate 12 hours prior to your test. Do not take any antihistamines 12 hours prior to your test.   On the Day of the Test: Drink plenty of water until 1 hour prior to the test. Do not eat any food 1 hour prior to test. You may take your regular medications prior to the test.  Take metoprolol (Lopressor) ONE (1) TABLET BY MOUTH ( 25 MG)  two hours prior to test.     After the Test: Drink plenty of water. After receiving IV contrast, you may experience a mild flushed feeling. This is normal. On occasion, you may experience a mild rash up to 24 hours after the test. This is not dangerous. If this occurs, you  can take Benadryl 25 mg, Zyrtec, Claritin, or Allegra and increase your fluid intake. (Patients taking Tikosyn should avoid Benadryl, and may take Zyrtec, Claritin, or Allegra) If you experience trouble breathing, this can be serious. If it is severe call 911 IMMEDIATELY. If it is mild, please call our office.  We will call to schedule your test 2-4 weeks out understanding that some insurance companies will need an authorization prior to the service being performed.   For more information and frequently asked questions, please visit our website : [email or web address]  For non-scheduling related questions, please contact the cardiac imaging nurse navigator should you have any questions/concerns: Cardiac Imaging Nurse Navigators Direct Office Dial: [phone]   For scheduling needs, including cancellations and rescheduling, please call Brittany, [phone].   Follow-Up: At [facility], you and your health needs are our priority.  As part of our continuing mission to provide you with exceptional heart care, our providers are all part of one team.  This team includes your primary Cardiologist (physician) and Advanced Practice Providers or APPs (Physician Assistants and Nurse Practitioners) who all work together to provide you with the care you need, when you need it.  Your next appointment:   6 month(s)  Provider:   Rosaline Bane, NP    We recommend signing up for the patient portal called MyChart.  Sign up information is provided on this After Visit Summary.  MyChart is used to connect with patients for  Virtual Visits (Telemedicine).  Patients are able to view lab/test results, encounter notes, upcoming appointments, etc.  Non-urgent messages can be sent to your provider as well.   To learn more about what you can do with MyChart, go to [email or web address].   Other Instructions  Your physician wants you to follow-up in: 6 months.  You will receive a  reminder letter in the mail two months in advance. If you don't receive a letter, please call our office to schedule the follow-up appointment.

## 2024-06-08 NOTE — Telephone Encounter (Signed)
   Pharmacy Patient Advocate Encounter   Received notification from Pt Calls Messages that prior authorization for REPATHA is required/requested.   Insurance verification completed.   The patient is insured through [facility].   Per test claim: PA required; PA submitted to above mentioned insurance via Latent Key/confirmation #/[id number] Status is pending

## 2024-06-09 ENCOUNTER — Other Ambulatory Visit (HOSPITAL_COMMUNITY): Payer: Self-pay

## 2024-06-09 ENCOUNTER — Ambulatory Visit (HOSPITAL_BASED_OUTPATIENT_CLINIC_OR_DEPARTMENT_OTHER): Payer: Self-pay | Admitting: Nurse Practitioner

## 2024-06-09 ENCOUNTER — Telehealth: Payer: BC Managed Care – PPO | Admitting: Adult Health

## 2024-06-09 ENCOUNTER — Encounter: Payer: Self-pay | Admitting: Adult Health

## 2024-06-09 DIAGNOSIS — G4733 Obstructive sleep apnea (adult) (pediatric): Secondary | ICD-10-CM | POA: Diagnosis not present

## 2024-06-09 LAB — BASIC METABOLIC PANEL WITH GFR
BUN/Creatinine Ratio: 9 (ref 9–20)
BUN: 11 mg/dL (ref 6–24)
CO2: 24 mmol/L (ref 20–29)
Calcium: 9.1 mg/dL (ref 8.7–10.2)
Chloride: 101 mmol/L (ref 96–106)
Creatinine, Ser: 1.16 mg/dL (ref 0.76–1.27)
Glucose: 98 mg/dL (ref 70–99)
Potassium: 4.6 mmol/L (ref 3.5–5.2)
Sodium: 140 mmol/L (ref 134–144)
eGFR: 75 mL/min/1.73 (ref >59)

## 2024-06-09 NOTE — Progress Notes (Signed)
 [facility] [address] [place]. [place] [address] [phone]       OFFICE FOLLOW UP NOTE  [name] Date of Birth:  11-Jun-1971 Medical Record Number:  [id number]   Reason for visit: CPAP follow-up    Virtual Visit via Video Note  I connected with Brandon Williams on 06/09/24 at  3:45 PM EST by a video enabled telemedicine application and verified that I am speaking with the correct person using two identifiers.  Location: Patient: at home Provider: in office, [facility]   I discussed the limitations of evaluation and management by telemedicine and the availability of in person appointments. The patient expressed understanding and agreed to proceed.     SUBJECTIVE:  Follow-up visit:  Prior visit: 06/03/2023   Brief HPI:   Brandon Williams is a 53 y.o. male who was evaluated by Dr. Buck on 08/13/2021 for concern of underlying sleep apnea with reported snoring, excessive daytime somnolence and witnessed apneas.  ESS 15/24.  FSS 50/63.  Completed in lab sleep study 08/27/2021 which showed overall mild OSA although moderate during REM sleep and in supine position, with a total AHI of 6.3/hour, REM AHI of 16.7/hour, supine AHI of  20.4/hour and O2 nadir of 83%.  The near-absence of supine sleep may underestimate AHI and O2 nadir. Given the patient's medical history and sleep related complaints, treatment with positive airway pressure recommended.  CPAP initiated on 09/16/2021.  Pressure setting adjusted 12/2021 due to leaks and discomfort from 5-11 with EPR 1 to 5-10 with EPR 3.     Interval history:  Returns today for yearly CPAP compliance visit via MyChart video visit.  Compliance report as below shows excellent usage and optimal residual AHI. Continues to tolerate CPAP well.  Ensure he brings machine while traveling. He does purchase supplies out of pocket.  No questions or concerns today        ROS:   14 system review of systems performed  and negative with exception of those listed in HPI  PMH:  Past Medical History:  Diagnosis Date   Allergic rhinitis    Allergy    Family history of malignant hyperthermia    paternal grandmother and paternal uncle died in OR from MH   Hyperlipidemia    Hypertension    Thyroid disease     PSH:  Past Surgical History:  Procedure Laterality Date   BACK SURGERY     SPINE SURGERY  2005   Disc replacement L4/L5   thumb surgery      Social History:  Social History   Socioeconomic History   Marital status: Married    Spouse name: Not on file   Number of children: Not on file   Years of education: Not on file   Highest education level: Not on file  Occupational History   Not on file  Tobacco Use   Smoking status: Never    Passive exposure: Never   Smokeless tobacco: Never  Vaping Use   Vaping status: Never Used  Substance and Sexual Activity   Alcohol use: Yes    Alcohol/week: 4.0 standard drinks of alcohol    Types: 2 Cans of beer, 2 Shots of liquor per week   Drug use: Never   Sexual activity: Yes  Other Topics Concern   Not on file  Social History Narrative   Caffiene 1 cup coffee in am,  soda /tea. Education: HS some college.  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  Social Drivers of Corporate Investment Banker Strain: Low Risk  (01/23/2024)   Overall Financial Resource Strain (CARDIA)    Difficulty of Paying Living Expenses: Not hard at all  Food Insecurity: No Food Insecurity (01/23/2024)   Hunger Vital Sign    Worried About Running Out of Food in the Last Year: Never true    Ran Out of Food in the Last Year: Never true  Transportation Needs: No Transportation Needs (01/23/2024)   PRAPARE - Administrator, Civil Service (Medical): No    Lack of Transportation (Non-Medical): No  Physical Activity: Insufficiently Active (01/23/2024)   Exercise Vital Sign    Days of Exercise per Week: 1 day    Minutes of Exercise per Session: 60 min  Stress:  No Stress Concern Present (01/23/2024)   [employer] of Occupational Health - Occupational Stress Questionnaire    Feeling of Stress: Not at all  Social Connections: Moderately Integrated (01/23/2024)   Social Connection and Isolation Panel    Frequency of Communication with Friends and Family: More than three times a week    Frequency of Social Gatherings with Friends and Family: Once a week    Attends Religious Services: Patient declined    Database Administrator or Organizations: Yes    Attends Banker Meetings: 1 to 4 times per year    Marital Status: Married  Catering Manager Violence: Unknown (10/07/2021)   Received from [facility]   HITS    Physically Hurt: Not on file    Insult or Talk Down To: Not on file    Threaten Physical Harm: Not on file    Scream or Curse: Not on file    Family History:  Family History  Problem Relation Age of Onset   Multiple sclerosis Mother    Obstructive Sleep Apnea Mother    Heart disease Father    Heart attack Father    Thyroid disease Sister    Heart disease Paternal Grandfather    Colon cancer Neg Hx    Esophageal cancer Neg Hx    Stomach cancer Neg Hx    Rectal cancer Neg Hx     Medications:   Current Outpatient Medications on File Prior to Visit  Medication Sig Dispense Refill   aspirin EC 81 MG tablet Take 81 mg by mouth daily. Swallow whole.     atorvastatin  (LIPITOR) 40 MG tablet Take 1 tablet (40 mg total) by mouth at bedtime. 90 tablet 1   KRILL OIL PO Take 500 mg by mouth daily.     levothyroxine  (SYNTHROID ) 75 MCG tablet TAKE 1 TABLET BY MOUTH ONCE DAILY BEFORE BREAKFAST 90 tablet 3   metoprolol tartrate (LOPRESSOR) 25 MG tablet Take 1 tablet (25 mg total) by mouth once for 1 dose. Take 90-120 minutes prior to scan. Hold for SBP less than 110. 1 tablet 0   Multiple Vitamin (MULTI VITAMIN MENS PO) Take 1 tablet by mouth daily.     olmesartan  (BENICAR ) 5 MG tablet Take 1 tablet by mouth once daily 90 tablet  0   No current facility-administered medications on file prior to visit.    Allergies:  No Known Allergies    OBJECTIVE:  Physical Exam  General: well developed, well nourished, very pleasant middle-age Caucasian male, seated, in no evident   Neurologic Exam Mental Status: Awake and fully alert. Oriented to place and time. Recent and remote memory intact. Attention span, concentration and fund of knowledge appropriate. Mood and  affect appropriate.        ASSESSMENT: Brandon Williams is a 53 y.o. year old male with diagnosis of mild to moderate OSA after completion of in lab sleep study in 08/2021 and initiation of CPAP 09/16/2021.      PLAN:  OSA on CPAP :  Compliance report shows excellent compliance with optimal residual AHI.   Continue current pressure settings 5-10 with EPR 3.  Discussed importance of continue nightly usage and ensuring greater than 4 hours nightly for optimal benefit and for insurance purposes.   He will continue to follow with DME company [employer] for any needed supplies or CPAP related concerns. CPAP set up 09/2021, due for new machine 09/2025    Follow up in 1 year via MyChart video visit or call earlier if needed   CC:  PCP: Alvia Bring, DO     Harlene Bogaert, AGNP-BC  [facility] [address] [place], [place] [address]  Phone [phone] Fax [phone] Note: This document was prepared with digital dictation and possible smart phrase technology. Any transcriptional errors that result from this process are unintentional.

## 2024-06-10 MED ORDER — REPATHA SURECLICK 140 MG/ML ~~LOC~~ SOAJ: 1.0000 mL | SUBCUTANEOUS | 1 refills | Status: AC

## 2024-06-10 NOTE — Addendum Note (Signed)
 Addended by: DARRELL BRUCKNER on: 06/10/2024 01:21 PM   Modules accepted: Orders

## 2024-06-22 ENCOUNTER — Other Ambulatory Visit: Payer: Self-pay | Admitting: Family Medicine

## 2024-06-22 DIAGNOSIS — I10 Essential (primary) hypertension: Secondary | ICD-10-CM

## 2024-06-24 ENCOUNTER — Encounter (HOSPITAL_COMMUNITY): Payer: Self-pay

## 2024-06-28 ENCOUNTER — Ambulatory Visit (HOSPITAL_BASED_OUTPATIENT_CLINIC_OR_DEPARTMENT_OTHER)
Admission: RE | Admit: 2024-06-28 | Discharge: 2024-06-28 | Disposition: A | Discharge status: Home / Self Care | Source: Ambulatory Visit | Attending: Nurse Practitioner | Admitting: Nurse Practitioner

## 2024-06-28 DIAGNOSIS — I10 Essential (primary) hypertension: Secondary | ICD-10-CM | POA: Insufficient documentation

## 2024-06-28 DIAGNOSIS — E785 Hyperlipidemia, unspecified: Secondary | ICD-10-CM | POA: Diagnosis present

## 2024-06-28 DIAGNOSIS — R931 Abnormal findings on diagnostic imaging of heart and coronary circulation: Secondary | ICD-10-CM | POA: Insufficient documentation

## 2024-06-28 MED ORDER — IOHEXOL 350 MG/ML SOLN
100.0000 mL | Freq: Once | INTRAVENOUS | Status: AC | PRN
  Administered 2024-06-28: 95 mL via INTRAVENOUS

## 2024-06-28 MED ORDER — NITROGLYCERIN 0.4 MG SL SUBL
0.8000 mg | SUBLINGUAL_TABLET | Freq: Once | SUBLINGUAL | Status: AC
  Administered 2024-06-28: 0.8 mg via SUBLINGUAL

## 2024-07-19 ENCOUNTER — Other Ambulatory Visit: Payer: Self-pay | Admitting: Family Medicine

## 2024-07-19 DIAGNOSIS — Z125 Encounter for screening for malignant neoplasm of prostate: Secondary | ICD-10-CM

## 2024-07-19 DIAGNOSIS — Z0000 Encounter for general adult medical examination without abnormal findings: Secondary | ICD-10-CM

## 2024-07-19 DIAGNOSIS — E785 Hyperlipidemia, unspecified: Secondary | ICD-10-CM

## 2024-07-19 DIAGNOSIS — E039 Hypothyroidism, unspecified: Secondary | ICD-10-CM

## 2024-07-19 DIAGNOSIS — I10 Essential (primary) hypertension: Secondary | ICD-10-CM

## 2024-07-19 NOTE — Telephone Encounter (Signed)
 Pls contact pt to schedule 6 month follow-up due January. Thanks

## 2024-07-20 ENCOUNTER — Other Ambulatory Visit: Payer: Self-pay | Admitting: *Deleted

## 2024-07-20 DIAGNOSIS — R931 Abnormal findings on diagnostic imaging of heart and coronary circulation: Secondary | ICD-10-CM

## 2024-07-20 DIAGNOSIS — I2510 Atherosclerotic heart disease of native coronary artery without angina pectoris: Secondary | ICD-10-CM

## 2024-07-20 DIAGNOSIS — E785 Hyperlipidemia, unspecified: Secondary | ICD-10-CM

## 2024-07-26 ENCOUNTER — Encounter (HOSPITAL_BASED_OUTPATIENT_CLINIC_OR_DEPARTMENT_OTHER): Payer: Self-pay

## 2024-07-26 LAB — CMP14+EGFR
ALT: 67 IU/L — ABNORMAL HIGH (ref 0–44)
AST: 74 IU/L — ABNORMAL HIGH (ref 0–40)
Albumin: 4.7 g/dL (ref 3.8–4.9)
Alkaline Phosphatase: 64 IU/L (ref 47–123)
BUN/Creatinine Ratio: 17 (ref 9–20)
BUN: 17 mg/dL (ref 6–24)
Bilirubin Total: 0.6 mg/dL (ref 0.0–1.2)
CO2: 22 mmol/L (ref 20–29)
Calcium: 8.9 mg/dL (ref 8.7–10.2)
Chloride: 105 mmol/L (ref 96–106)
Creatinine, Ser: 1.01 mg/dL (ref 0.76–1.27)
Globulin, Total: 2.1 g/dL (ref 1.5–4.5)
Glucose: 107 mg/dL — ABNORMAL HIGH (ref 70–99)
Potassium: 4.6 mmol/L (ref 3.5–5.2)
Sodium: 141 mmol/L (ref 134–144)
Total Protein: 6.8 g/dL (ref 6.0–8.5)
eGFR: 89 mL/min/1.73

## 2024-07-26 LAB — CBC WITH DIFFERENTIAL/PLATELET
Basophils Absolute: 0 x10E3/uL (ref 0.0–0.2)
Basos: 0 %
EOS (ABSOLUTE): 0.1 x10E3/uL (ref 0.0–0.4)
Eos: 1 %
Hematocrit: 44.8 % (ref 37.5–51.0)
Hemoglobin: 15 g/dL (ref 13.0–17.7)
Immature Grans (Abs): 0 x10E3/uL (ref 0.0–0.1)
Immature Granulocytes: 0 %
Lymphocytes Absolute: 1.7 x10E3/uL (ref 0.7–3.1)
Lymphs: 32 %
MCH: 32.1 pg (ref 26.6–33.0)
MCHC: 33.5 g/dL (ref 31.5–35.7)
MCV: 96 fL (ref 79–97)
Monocytes Absolute: 0.6 x10E3/uL (ref 0.1–0.9)
Monocytes: 10 %
Neutrophils Absolute: 3.1 x10E3/uL (ref 1.4–7.0)
Neutrophils: 57 %
Platelets: 154 x10E3/uL (ref 150–450)
RBC: 4.67 x10E6/uL (ref 4.14–5.80)
RDW: 12.3 % (ref 11.6–15.4)
WBC: 5.5 x10E3/uL (ref 3.4–10.8)

## 2024-07-26 LAB — LIPID PANEL
Chol/HDL Ratio: 1.9 ratio (ref 0.0–5.0)
Cholesterol, Total: 65 mg/dL — ABNORMAL LOW (ref 100–199)
HDL: 34 mg/dL — ABNORMAL LOW
LDL Chol Calc (NIH): 11 mg/dL (ref 0–99)
Triglycerides: 107 mg/dL (ref 0–149)
VLDL Cholesterol Cal: 20 mg/dL (ref 5–40)

## 2024-07-26 LAB — PSA: Prostate Specific Ag, Serum: 1.6 ng/mL (ref 0.0–4.0)

## 2024-07-26 LAB — TSH: TSH: 2.97 u[IU]/mL (ref 0.450–4.500)

## 2024-08-05 ENCOUNTER — Ambulatory Visit: Payer: Self-pay | Admitting: Family Medicine
